# Patient Record
Sex: Male | Born: 1970 | Race: Black or African American | Hispanic: No | Marital: Single | Smoking: Current every day smoker
Health system: Southern US, Community
[De-identification: ages and names within clinical notes are randomized; demographics above are authoritative.]

## PROBLEM LIST (undated history)

## (undated) DIAGNOSIS — N529 Male erectile dysfunction, unspecified: Secondary | ICD-10-CM

## (undated) DIAGNOSIS — R7303 Prediabetes: Secondary | ICD-10-CM

## (undated) DIAGNOSIS — H1010 Acute atopic conjunctivitis, unspecified eye: Secondary | ICD-10-CM

## (undated) DIAGNOSIS — I10 Essential (primary) hypertension: Secondary | ICD-10-CM

## (undated) DIAGNOSIS — K219 Gastro-esophageal reflux disease without esophagitis: Secondary | ICD-10-CM

## (undated) DIAGNOSIS — L723 Sebaceous cyst: Secondary | ICD-10-CM

## (undated) DIAGNOSIS — E785 Hyperlipidemia, unspecified: Secondary | ICD-10-CM

## (undated) DIAGNOSIS — F1010 Alcohol abuse, uncomplicated: Secondary | ICD-10-CM

## (undated) HISTORY — PX: OTHER SURGICAL HISTORY: SHX169

---

## 2015-06-03 ENCOUNTER — Emergency Department
Admission: EM | Admit: 2015-06-03 | Discharge: 2015-06-03 | Disposition: A | Discharge status: Home / Self Care | Payer: BLUE CROSS/BLUE SHIELD | Attending: Emergency Medicine | Admitting: Emergency Medicine

## 2015-06-03 ENCOUNTER — Encounter: Payer: Self-pay | Admitting: Emergency Medicine

## 2015-06-03 DIAGNOSIS — I10 Essential (primary) hypertension: Secondary | ICD-10-CM | POA: Insufficient documentation

## 2015-06-03 DIAGNOSIS — H1011 Acute atopic conjunctivitis, right eye: Secondary | ICD-10-CM | POA: Diagnosis not present

## 2015-06-03 DIAGNOSIS — H109 Unspecified conjunctivitis: Secondary | ICD-10-CM | POA: Diagnosis present

## 2015-06-03 DIAGNOSIS — Z720 Tobacco use: Secondary | ICD-10-CM | POA: Diagnosis not present

## 2015-06-03 HISTORY — DX: Essential (primary) hypertension: I10

## 2015-06-03 MED ORDER — DIPHENHYDRAMINE HCL 50 MG PO CAPS
50.0000 mg | ORAL_CAPSULE | Freq: Once | ORAL | Status: AC
  Administered 2015-06-03: 50 mg via ORAL

## 2015-06-03 MED ORDER — HYDROXYZINE PAMOATE 25 MG PO CAPS: 25.0000 mg | ORAL_CAPSULE | Freq: Three times a day (TID) | ORAL | Status: DC | PRN

## 2015-06-03 MED ORDER — NEOMYCIN-POLYMYXIN-DEXAMETH 3.5-10000-0.1 OP SUSP
OPHTHALMIC | Status: AC
  Administered 2015-06-03: 2 [drp] via OPHTHALMIC
  Filled 2015-06-03: qty 5

## 2015-06-03 MED ORDER — DIPHENHYDRAMINE HCL 50 MG PO CAPS
ORAL_CAPSULE | ORAL | Status: AC
  Administered 2015-06-03: 50 mg via ORAL
  Filled 2015-06-03: qty 1

## 2015-06-03 MED ORDER — NEOMYCIN-POLYMYXIN-DEXAMETH 3.5-10000-0.1 OP SUSP
2.0000 [drp] | Freq: Four times a day (QID) | OPHTHALMIC | Status: DC
  Administered 2015-06-03: 2 [drp] via OPHTHALMIC

## 2015-06-03 MED ORDER — KETOROLAC TROMETHAMINE 10 MG PO TABS: 10.0000 mg | ORAL_TABLET | Freq: Four times a day (QID) | ORAL | Status: DC | PRN

## 2015-06-03 MED ORDER — LORATADINE 10 MG PO TABS: 10.0000 mg | ORAL_TABLET | Freq: Every day | ORAL | Status: DC

## 2015-06-03 MED ORDER — HYDROCHLOROTHIAZIDE 12.5 MG PO CAPS: 12.5000 mg | ORAL_CAPSULE | Freq: Every day | ORAL | Status: DC

## 2015-06-03 MED ORDER — KETOROLAC TROMETHAMINE 10 MG PO TABS
ORAL_TABLET | ORAL | Status: AC
  Administered 2015-06-03: 10 mg via ORAL
  Filled 2015-06-03: qty 1

## 2015-06-03 MED ORDER — KETOROLAC TROMETHAMINE 10 MG PO TABS
10.0000 mg | ORAL_TABLET | Freq: Once | ORAL | Status: AC
  Administered 2015-06-03: 10 mg via ORAL

## 2015-06-03 NOTE — ED Provider Notes (Signed)
[facility] Emergency Department Provider Note  ____________________________________________  Time seen: Approximately1510  I have reviewed the triage vital signs and the nursing notes.   HISTORY  Chief Complaint Conjunctivitis    HPI Calvin Taylor is a 44 y.o. 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 Hypertension     There are no active problems to display for this patient.   Past Surgical History  Procedure Laterality Date  . Right eye surgery      Current Outpatient Rx  Name  Route  Sig  Dispense  Refill  . hydrOXYzine (VISTARIL) 25 MG capsule   Oral   Take 1 capsule (25 mg total) by mouth every 8 (eight) hours as needed (allergies).   15 capsule   0   . ketorolac (TORADOL) 10 MG tablet   Oral   Take 1 tablet (10 mg total) by mouth every 6 (six) hours as needed.   20 tablet   0   . loratadine (CLARITIN) 10 MG tablet   Oral   Take 1 tablet (10 mg total) by mouth daily.   30 tablet   2     Allergies Review of patient's allergies indicates no known allergies.  No family history on file.  Social History History  Substance Use Topics  . Smoking status: Current Every Day Smoker    Types: Cigarettes  . Smokeless tobacco: Never Used  . Alcohol Use: 3.6 oz/week    6 Cans of beer per week    Review of Systems Constitutional: No fever/chills Eyes: No visual  changes. ENT: No sore throat. Cardiovascular: Denies chest pain. Respiratory: Denies shortness of breath. Gastrointestinal: No abdominal pain.  No nausea, no vomiting.  No diarrhea.  No constipation. Genitourinary: Negative for dysuria. Musculoskeletal: Negative for back pain. Skin: Negative for rash. Neurological: Negative for headaches, focal weakness or numbness.  10-point ROS otherwise negative.  ____________________________________________   PHYSICAL EXAM:  VITAL SIGNS: ED Triage Vitals  Enc Vitals Group     BP 06/03/15 1320 170/115 mmHg     Pulse Rate 06/03/15 1320 77     Resp 06/03/15 1320 18     Temp 06/03/15 1320 98.2 F (36.8 C)     Temp Source 06/03/15 1320 Oral     SpO2 06/03/15 1320 99 %     Weight 06/03/15 1320 161 lb (73.029 kg)     Height 06/03/15 1320 5\' 9"  (1.753 m)     Head Cir --      Peak Flow --      Pain Score 06/03/15 1323 5     Pain Loc --      Pain Edu? --      Excl. in [place]? --     Constitutional: Alert and oriented. Well appearing and in no acute distress. Eyes: Right eye red and injected conjunctiva mild swelling to the eyelids of the right  eye normal funduscopic exam clear humerus of the right eye. PERRL. EOMI. matting of the eyelashes Head: Atraumatic. Nose: Clear rhinorrhea Mouth/Throat: Mucous membranes are moist.  Oropharynx non-erythematous. Neck: No stridor.   Cardiovascular: Normal rate, regular rhythm. Grossly normal heart sounds.  Good peripheral circulation. Respiratory: Normal respiratory effort.  No retractions. Lungs CTAB. Gastrointestinal: Soft and nontender. No distention. No abdominal bruits. No CVA tenderness. Musculoskeletal: No lower extremity tenderness nor edema.  No joint effusions. Neurologic:  Normal speech and language. No gross focal neurologic deficits are appreciated. Speech is normal. No gait instability. Skin:  Skin is warm, dry and intact. No rash noted. Psychiatric: Mood and affect are normal. Speech and  behavior are normal.  ____________________________________________     PROCEDURES  Procedure(s) performed: None  Critical Care performed: No  ____________________________________________   INITIAL IMPRESSION / ASSESSMENT AND PLAN / ED COURSE  Pertinent labs & imaging results that were available during my care of the patient were reviewed by me and considered in my medical decision making (see chart for details).  Impression on this patient is allergic conjunctivitis that is turned into a bacterial conjunctivitis patient states around this time of years eyes water really badly eats normally controlled with Claritin he was at work couple days ago states his eyes really watering he rubbed his eye with the back of his club since and it's Red his eyes started matting and he has a thick discharge on the outside of his lashes says his vision is blurry due to the extra watering and itching on exam equal reactive pupils normal funduscopic exam and discharge him on antibiotic drops and a histamines anti-inflammatory 7 follow-up with ophthalmology in 2 days if symptoms persist  As of note the patient is a long-standing history of hypertension he has been off of his medicine for a couple years he knows his blood pressure is high but states the last time he took medicine it made his muscles cramp so he quit taking it and then quit going to a primary care provider I have written him a prescription for hydrochlorothiazide given him instructions on hypertension and diet trying to monitor his hypertension instructed him to follow up with primary care as soon as possible for his elevated blood pressure ____________________________________________   FINAL CLINICAL IMPRESSION(S) / ED DIAGNOSES  Final diagnoses:  Allergic conjunctivitis, right  Conjunctivitis of right eye   hypertension   Kloi Brodman Verdene Rio, PA-C 06/03/15 1609  Delman Kitten, MD 06/03/15 2359

## 2015-06-03 NOTE — ED Notes (Signed)
Pt reports 2 days ago redness, drainage and pain to right eye. Blurry vision to right eye.

## 2015-06-03 NOTE — ED Notes (Signed)
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

## 2015-06-03 NOTE — Discharge Instructions (Signed)
Allergic Conjunctivitis  The conjunctiva is a thin membrane that covers the visible white part of the eyeball and the underside of the eyelids. This membrane protects and lubricates the eye. The membrane has small blood vessels running through it that can normally be seen. When the conjunctiva becomes inflamed, the condition is called conjunctivitis. In response to the inflammation, the conjunctival blood vessels become swollen. The swelling results in redness in the normally white part of the eye.  The blood vessels of this membrane also react when a person has allergies and is then called allergic conjunctivitis. This condition usually lasts for as long as the allergy persists. Allergic conjunctivitis cannot be passed to another person (non-contagious). The likelihood of bacterial infection is great and the cause is not likely due to allergies if the inflamed eye has:  · A sticky discharge.  · Discharge or sticking together of the lids in the morning.  · Scaling or flaking of the eyelids where the eyelashes come out.  · Red swollen eyelids.  CAUSES   · Viruses.  · Irritants such as foreign bodies.  · Chemicals.  · General allergic reactions.  · Inflammation or serious diseases in the inside or the outside of the eye or the orbit (the boney cavity in which the eye sits) can cause a "red eye."  SYMPTOMS   · Eye redness.  · Tearing.  · Itchy eyes.  · Burning feeling in the eyes.  · Clear drainage from the eye.  · Allergic reaction due to pollens or ragweed sensitivity. Seasonal allergic conjunctivitis is frequent in the spring when pollens are in the air and in the fall.  DIAGNOSIS   This condition, in its many forms, is usually diagnosed based on the history and an ophthalmological exam. It usually involves both eyes. If your eyes react at the same time every year, allergies may be the cause. While most "red eyes" are due to allergy or an infection, the role of an eye (ophthalmological) exam is important. The exam  can rule out serious diseases of the eye or orbit.  TREATMENT   · Non-antibiotic eye drops, ointments, or medications by mouth may be prescribed if the ophthalmologist is sure the conjunctivitis is due to allergies alone.  · Over-the-counter drops and ointments for allergic symptoms should be used only after other causes of conjunctivitis have been ruled out, or as your caregiver suggests.  Medications by mouth are often prescribed if other allergy-related symptoms are present. If the ophthalmologist is sure that the conjunctivitis is due to allergies alone, treatment is normally limited to drops or ointments to reduce itching and burning.  HOME CARE INSTRUCTIONS   · Wash hands before and after applying drops or ointments, or touching the inflamed eye(s) or eyelids.  · Do not let the eye dropper tip or ointment tube touch the eyelid when putting medicine in your eye.  · Stop using your soft contact lenses and throw them away. Use a new pair of lenses when recovery is complete. You should run through sterilizing cycles at least three times before use after complete recovery if the old soft contact lenses are to be used. Hard contact lenses should be stopped. They need to be thoroughly sterilized before use after recovery.  · Itching and burning eyes due to allergies is often relieved by using a cool cloth applied to closed eye(s).  SEEK MEDICAL CARE IF:   · Your problems do not go away after two or three days of treatment.  ·   have extreme light sensitivity.  An oral temperature above 102 F (38.9 C) develops.  Pain in or around the eye or any other visual symptom develops. MAKE SURE YOU:   Understand these instructions.  Will watch your condition.  Will get help right away if you are not doing well or get worse. Document  Released: 02/21/2003 Document Revised: 02/23/2012 Document Reviewed: 01/17/2008 [facility] Patient Information 2015 [place], [place]. This information is not intended to replace advice given to you by your health care provider. Make sure you discuss any questions you have with your health care provider.  Bacterial Conjunctivitis Bacterial conjunctivitis, commonly called pink eye, is an inflammation of the clear membrane that covers the white part of the eye (conjunctiva). The inflammation can also happen on the underside of the eyelids. The blood vessels in the conjunctiva become inflamed, causing the eye to become red or pink. Bacterial conjunctivitis may spread easily from one eye to another and from person to person (contagious).  CAUSES  Bacterial conjunctivitis is caused by bacteria. The bacteria may come from your own skin, your upper respiratory tract, or from someone else with bacterial conjunctivitis. SYMPTOMS  The normally white color of the eye or the underside of the eyelid is usually pink or red. The pink eye is usually associated with irritation, tearing, and some sensitivity to light. Bacterial conjunctivitis is often associated with a thick, yellowish discharge from the eye. The discharge may turn into a crust on the eyelids overnight, which causes your eyelids to stick together. If a discharge is present, there may also be some blurred vision in the affected eye. DIAGNOSIS  Bacterial conjunctivitis is diagnosed by your caregiver through an eye exam and the symptoms that you report. Your caregiver looks for changes in the surface tissues of your eyes, which may point to the specific type of conjunctivitis. A sample of any discharge may be collected on a cotton-tip swab if you have a severe case of conjunctivitis, if your cornea is affected, or if you keep getting repeat infections that do not respond to treatment. The sample will be sent to a lab to see if the inflammation is caused by a  bacterial infection and to see if the infection will respond to antibiotic medicines. TREATMENT   Bacterial conjunctivitis is treated with antibiotics. Antibiotic eyedrops are most often used. However, antibiotic ointments are also available. Antibiotics pills are sometimes used. Artificial tears or eye washes may ease discomfort. HOME CARE INSTRUCTIONS   To ease discomfort, apply a cool, clean washcloth to your eye for 10-20 minutes, 3-4 times a day.  Gently wipe away any drainage from your eye with a warm, wet washcloth or a cotton ball.  Wash your hands often with soap and water. Use paper towels to dry your hands.  Do not share towels or washcloths. This may spread the infection.  Change or wash your pillowcase every day.  You should not use eye makeup until the infection is gone.  Do not operate machinery or drive if your vision is blurred.  Stop using contact lenses. Ask your caregiver how to sterilize or replace your contacts before using them again. This depends on the type of contact lenses that you use.  When applying medicine to the infected eye, do not touch the edge of your eyelid with the eyedrop bottle or ointment tube. SEEK IMMEDIATE MEDICAL CARE IF:   Your infection has not improved within 3 days after beginning treatment.  You had yellow discharge from your eye  and it returns.  You have increased eye pain.  Your eye redness is spreading.  Your vision becomes blurred.  You have a fever or persistent symptoms for more than 2-3 days.  You have a fever and your symptoms suddenly get worse.  You have facial pain, redness, or swelling. MAKE SURE YOU:   Understand these instructions.  Will watch your condition.  Will get help right away if you are not doing well or get worse. Document Released: 12/01/2005 Document Revised: 04/17/2014 Document Reviewed: 05/03/2012 [facility] Patient Information 2015 [place], [place]. This information is not intended to replace  advice given to you by your health care provider. Make sure you discuss any questions you have with your health care provider.

## 2018-04-28 DIAGNOSIS — I10 Essential (primary) hypertension: Secondary | ICD-10-CM | POA: Insufficient documentation

## 2018-04-30 DIAGNOSIS — R7303 Prediabetes: Secondary | ICD-10-CM | POA: Insufficient documentation

## 2019-03-11 ENCOUNTER — Telehealth: Payer: Self-pay | Admitting: Family Medicine

## 2019-03-11 ENCOUNTER — Other Ambulatory Visit (HOSPITAL_COMMUNITY)
Admission: RE | Admit: 2019-03-11 | Discharge: 2019-03-11 | Disposition: A | Discharge status: Home / Self Care | Payer: BLUE CROSS/BLUE SHIELD | Source: Ambulatory Visit | Attending: Family Medicine | Admitting: Family Medicine

## 2019-03-11 ENCOUNTER — Ambulatory Visit (INDEPENDENT_AMBULATORY_CARE_PROVIDER_SITE_OTHER): Payer: BLUE CROSS/BLUE SHIELD | Admitting: Family Medicine

## 2019-03-11 ENCOUNTER — Other Ambulatory Visit: Payer: Self-pay

## 2019-03-11 ENCOUNTER — Encounter: Payer: Self-pay | Admitting: Family Medicine

## 2019-03-11 VITALS — BP 158/84 | HR 92 | Resp 16 | Ht 70.0 in | Wt 166.5 lb

## 2019-03-11 DIAGNOSIS — Z1159 Encounter for screening for other viral diseases: Secondary | ICD-10-CM | POA: Diagnosis not present

## 2019-03-11 DIAGNOSIS — N529 Male erectile dysfunction, unspecified: Secondary | ICD-10-CM

## 2019-03-11 DIAGNOSIS — R7303 Prediabetes: Secondary | ICD-10-CM | POA: Diagnosis not present

## 2019-03-11 DIAGNOSIS — Z125 Encounter for screening for malignant neoplasm of prostate: Secondary | ICD-10-CM | POA: Diagnosis not present

## 2019-03-11 DIAGNOSIS — Z113 Encounter for screening for infections with a predominantly sexual mode of transmission: Secondary | ICD-10-CM | POA: Insufficient documentation

## 2019-03-11 DIAGNOSIS — I10 Essential (primary) hypertension: Secondary | ICD-10-CM

## 2019-03-11 DIAGNOSIS — D234 Other benign neoplasm of skin of scalp and neck: Secondary | ICD-10-CM

## 2019-03-11 DIAGNOSIS — F1010 Alcohol abuse, uncomplicated: Secondary | ICD-10-CM | POA: Diagnosis not present

## 2019-03-11 DIAGNOSIS — Z23 Encounter for immunization: Secondary | ICD-10-CM

## 2019-03-11 DIAGNOSIS — K219 Gastro-esophageal reflux disease without esophagitis: Secondary | ICD-10-CM | POA: Diagnosis not present

## 2019-03-11 DIAGNOSIS — Z13220 Encounter for screening for lipoid disorders: Secondary | ICD-10-CM

## 2019-03-11 DIAGNOSIS — Z114 Encounter for screening for human immunodeficiency virus [HIV]: Secondary | ICD-10-CM | POA: Diagnosis not present

## 2019-03-11 MED ORDER — HYDROCHLOROTHIAZIDE 12.5 MG PO CAPS: 12.5000 mg | ORAL_CAPSULE | Freq: Every day | ORAL | 1 refills | Status: DC

## 2019-03-11 MED ORDER — FAMOTIDINE 40 MG PO TABS: 40.0000 mg | ORAL_TABLET | Freq: Every day | ORAL | 1 refills | Status: DC

## 2019-03-11 MED ORDER — TADALAFIL 20 MG PO TABS: 10.0000 mg | ORAL_TABLET | ORAL | 3 refills | Status: DC | PRN

## 2019-03-11 NOTE — Telephone Encounter (Signed)
Copied from [place] [phone]. Topic: Quick Communication - Rx Refill/Question >> Mar 11, 2019  2:13 PM Scherrie Gerlach wrote: Medication: loratadine (CLARITIN) 10 MG tablet  Pt was seen today and wants to know if the dr will send in a Rx for this med as well. [place] (N), [place] [facility] [place] [phone] (Phone) [phone] (Fax)

## 2019-03-11 NOTE — Patient Instructions (Signed)
Download [place] on your phone for your upcoming video visit.

## 2019-03-11 NOTE — Progress Notes (Signed)
Name: Calvin Taylor   MRN: [id number]    DOB: [birth year]   Date:03/11/2019       Progress Note  Subjective  Chief Complaint  Chief Complaint  Patient presents with  . Establish Care  . Hypertension  . Sinusitis  . Cyst    on back of head  . Gastroesophageal Reflux    HPI  GERD: Notes heartburn and sour taste in mouth ongoing for about a month, states sometimes worse at night when he lays down.  Has not taken anything for the problem and denies vomiting, BRB in stool or dark and tarry stool, abdominal pain, or difficulty swallowing.  HTN: States thinks it is genetic, has been on medication for a few months now - was on something back in 2012 as well.  BP is not at goal yet.  Currently taking lisinopril 20mg .  Denies chest pain, shortness of breath, swelling in LE, blurred vision, or headaches.  He has a BP cuff at home and can check at home.  Allergies: Taking claritin PRN - gets watery eyes, rhinorrhea.  Doing well at this time.  Alcohol Abuse: Drinking 12oz cans - 6-7 a day and a few shots of liquor.  Has been drinking heavily since he was about 72-9 years old. Was working for a moving company, but quit recently.  His first drink is around 5pm, last drink is around 8pm.  He does not feel that his alcohol abuse is an issue at this time. Significant counseling is provided.    Office Visit from 03/11/2019 in [employer]  AUDIT-C Score  8     Cysts:  Has two cysts on head/neck - one on the back of the scalp on the RIGHT side, one on the posterior neck on the right side.  Present for about 1 month, have been getting larger.  Has had some shooting pain from the cyst on the scalp to the cyst on the neck.  No vision changes, full neck AROM.  Prediabetes: Last A1C was 6.1; denies polydipsia, polyphagia.  Does have occasional nocturia, has some urinary frequency during the day.  ED: He is having trouble since taking lisinopril - has been taking OTC medication that had  been helping but caused palpitations. Advised to stop OTC supplements, we will send in Cialis. When able to achieve erection, is able to also achieve orgasm.   There are no active problems to display for this patient.   Past Surgical History:  Procedure Laterality Date  . right eye surgery      Family History  Problem Relation Age of Onset  . Hypertension Mother   . Hypertension Father     Social History   Socioeconomic History  . Marital status: Single    Spouse name: Not on file  . Number of children: 1  . Years of education: Not on file  . Highest education level: Not on file  Occupational History  . Not on file  Social Needs  . Financial resource strain: Not hard at all  . Food insecurity:    Worry: Never true    Inability: Never true  . Transportation needs:    Medical: No    Non-medical: No  Tobacco Use  . Smoking status: Current Every Day Smoker    Types: Cigarettes  . Smokeless tobacco: Never Used  Substance and Sexual Activity  . Alcohol use: Yes    Alcohol/week: 6.0 standard drinks    Types: 6 Cans of beer per week  .  Drug use: No  . Sexual activity: Yes    Partners: Female  Lifestyle  . Physical activity:    Days per week: 0 days    Minutes per session: 0 min  . Stress: Not at all  Relationships  . Social connections:    Talks on phone: Twice a week    Gets together: Twice a week    Attends religious service: Never    Active member of club or organization: No    Attends meetings of clubs or organizations: Never    Relationship status: Never married  . Intimate partner violence:    Fear of current or ex partner: No    Emotionally abused: No    Physically abused: No    Forced sexual activity: No  Other Topics Concern  . Not on file  Social History Narrative  . Not on file     Current Outpatient Medications:  .  lisinopril (PRINIVIL,ZESTRIL) 20 MG tablet, Take by mouth., Disp: , Rfl:  .  hydrochlorothiazide (MICROZIDE) 12.5 MG capsule,  Take 1 capsule (12.5 mg total) by mouth daily. (Patient not taking: Reported on 03/11/2019), Disp: 30 capsule, Rfl: 1 .  hydrOXYzine (VISTARIL) 25 MG capsule, Take 1 capsule (25 mg total) by mouth every 8 (eight) hours as needed (allergies). (Patient not taking: Reported on 03/11/2019), Disp: 15 capsule, Rfl: 0 .  ketorolac (TORADOL) 10 MG tablet, Take 1 tablet (10 mg total) by mouth every 6 (six) hours as needed. (Patient not taking: Reported on 03/11/2019), Disp: 20 tablet, Rfl: 0 .  loratadine (CLARITIN) 10 MG tablet, Take 1 tablet (10 mg total) by mouth daily., Disp: 30 tablet, Rfl: 2  No Known Allergies  I personally reviewed active problem list, medication list, allergies, family history, social history, health maintenance, notes from last encounter, lab results with the patient/caregiver today.   ROS  Constitutional: Negative for fever or weight change.  Respiratory: Negative for cough and shortness of breath.   Cardiovascular: Negative for chest pain or palpitations.  Gastrointestinal: Negative for abdominal pain, no bowel changes.  Musculoskeletal: Negative for gait problem or joint swelling.  Skin: Negative for rash. See above regarding cysts Neurological: Negative for dizziness or headache.  No other specific complaints in a complete review of systems (except as listed in HPI above).  Objective  Vitals:   03/11/19 1030  BP: (!) 158/84  Pulse: 92  Resp: 16  SpO2: 99%  Weight: 166 lb 8 oz (75.5 kg)  Height: 5\' 10"  (1.778 m)   Body mass index is 23.89 kg/m.  Physical Exam  Constitutional: Patient appears well-developed and well-nourished. No distress.  HENT: Head: Normocephalic and atraumatic. Eyes: Conjunctivae and EOM are normal. No scleral icterus. Neck: Normal range of motion. Neck supple. No JVD present. No thyromegaly present.  Cardiovascular: Normal rate, regular rhythm and normal heart sounds.  No murmur heard. No BLE edema. Pulmonary/Chest: Effort normal and  breath sounds normal. No respiratory distress. Musculoskeletal: Normal range of motion, no joint effusions. No gross deformities Neurological: Pt is alert and oriented to person, place, and time. No cranial nerve deficit. Coordination, balance, strength, speech and gait are normal.  Skin: Skin is warm and dry. No rash noted. No erythema. Cm diameter cystic lesion on the RIGHt posterior occiput.  There is another small palpable mass on the posterior right neck - question cyst vs lymph node. Non tender. Psychiatric: Patient has a normal mood and affect. behavior is normal. Judgment and thought content normal.  No results  found for this or any previous visit (from the past 55 hour(s)).   PHQ2/9: Depression screen [facility] 2/9 03/11/2019  Decreased Interest 0  Down, Depressed, Hopeless 0  PHQ - 2 Score 0  Altered sleeping 0  Tired, decreased energy 0  Change in appetite 0  Feeling bad or failure about yourself  0  Trouble concentrating 0  Moving slowly or fidgety/restless 0  Suicidal thoughts 0  PHQ-9 Score 0  Difficult doing work/chores Not difficult at all   PHQ-2/9 Result is negative.    Fall Risk: Fall Risk  03/11/2019  Falls in the past year? 1  Number falls in past yr: 1  Injury with Fall? 0  Follow up Falls evaluation completed   Assessment & Plan  1. Essential hypertension - DASH diet discussed - COMPLETE METABOLIC PANEL WITH GFR - hydrochlorothiazide (MICROZIDE) 12.5 MG capsule; Take 1 capsule (12.5 mg total) by mouth daily.  Dispense: 30 capsule; Refill: 1  2. Gastroesophageal reflux disease without esophagitis - famotidine (PEPCID) 40 MG tablet; Take 1 tablet (40 mg total) by mouth at bedtime.  Dispense: 90 tablet; Refill: 1  3. Encounter for screening for HIV - HIV Antibody (routine testing w rflx)  4. Need for hepatitis C screening test - Hepatitis C antibody  5. Routine screening for STI (sexually transmitted infection) - HIV Antibody (routine testing w rflx) -  RPR - Urine cytology ancillary only  6. Need for Tdap vaccination - Tdap vaccine greater than or equal to 7yo IM  7. Cyst, dermoid, scalp and neck - Ambulatory referral to General Surgery  8. Alcohol abuse - Significant counseling  - COMPLETE METABOLIC PANEL WITH GFR - CBC with Differential/Platelet - B12 and Folate Panel - Vitamin B1  9. Prediabetes - diabetic diet and reductionin ETOH is discussed - Hemoglobin A1c  10. Lipid screening - Lipid panel  11. Prostate cancer screening - PSA  12. Erectile dysfunction, unspecified erectile dysfunction type - tadalafil (ADCIRCA/CIALIS) 20 MG tablet; Take 0.5-1 tablets (10-20 mg total) by mouth every other day as needed for erectile dysfunction.  Dispense: 10 tablet; Refill: 3

## 2019-03-14 LAB — B12 AND FOLATE PANEL
Folate: 12.5 ng/mL
VITAMIN B 12: 432 pg/mL (ref 200–1100)

## 2019-03-14 LAB — LIPID PANEL
Cholesterol: 194 mg/dL (ref <200)
HDL: 78 mg/dL (ref >=40)
LDL CHOLESTEROL (CALC): 85 mg/dL
Non-HDL Cholesterol (Calc): 116 mg/dL (calc) (ref <130)
Total CHOL/HDL Ratio: 2.5 (calc) (ref <5.0)
Triglycerides: 222 mg/dL — ABNORMAL HIGH (ref <150)

## 2019-03-14 LAB — COMPLETE METABOLIC PANEL WITH GFR
AG RATIO: 1.8 (calc) (ref 1.0–2.5)
ALBUMIN MSPROF: 4.6 g/dL (ref 3.6–5.1)
ALT: 13 U/L (ref 9–46)
AST: 18 U/L (ref 10–40)
Alkaline phosphatase (APISO): 85 U/L (ref 36–130)
BILIRUBIN TOTAL: 0.4 mg/dL (ref 0.2–1.2)
BUN: 17 mg/dL (ref 7–25)
CHLORIDE: 100 mmol/L (ref 98–110)
CO2: 29 mmol/L (ref 20–32)
Calcium: 9.6 mg/dL (ref 8.6–10.3)
Creat: 1.21 mg/dL (ref 0.60–1.35)
GFR, Est African American: 82 mL/min/{1.73_m2} (ref >=60)
GFR, Est Non African American: 70 mL/min/{1.73_m2} (ref >=60)
Globulin: 2.5 g/dL (calc) (ref 1.9–3.7)
Glucose, Bld: 86 mg/dL (ref 65–99)
POTASSIUM: 4.2 mmol/L (ref 3.5–5.3)
Sodium: 136 mmol/L (ref 135–146)
Total Protein: 7.1 g/dL (ref 6.1–8.1)

## 2019-03-14 LAB — CBC WITH DIFFERENTIAL/PLATELET
ABSOLUTE MONOCYTES: 529 {cells}/uL (ref 200–950)
BASOS PCT: 0.6 %
Basophils Absolute: 38 cells/uL (ref 0–200)
Eosinophils Absolute: 158 cells/uL (ref 15–500)
Eosinophils Relative: 2.5 %
HCT: 44.3 % (ref 38.5–50.0)
Hemoglobin: 15.5 g/dL (ref 13.2–17.1)
Lymphs Abs: 1392 cells/uL (ref 850–3900)
MCH: 32.2 pg (ref 27.0–33.0)
MCHC: 35 g/dL (ref 32.0–36.0)
MCV: 91.9 fL (ref 80.0–100.0)
MONOS PCT: 8.4 %
MPV: 11.1 fL (ref 7.5–12.5)
Neutro Abs: 4183 cells/uL (ref 1500–7800)
Neutrophils Relative %: 66.4 %
PLATELETS: 222 10*3/uL (ref 140–400)
RBC: 4.82 10*6/uL (ref 4.20–5.80)
RDW: 12.2 % (ref 11.0–15.0)
TOTAL LYMPHOCYTE: 22.1 %
WBC: 6.3 10*3/uL (ref 3.8–10.8)

## 2019-03-14 LAB — PSA: PSA: 0.6 ng/mL (ref <=4.0)

## 2019-03-14 LAB — VITAMIN B1: Vitamin B1 (Thiamine): 9 nmol/L (ref 8–30)

## 2019-03-14 LAB — HEPATITIS C ANTIBODY
HEP C AB: NONREACTIVE
SIGNAL TO CUT-OFF: 0.03 (ref <1.00)

## 2019-03-14 LAB — HEMOGLOBIN A1C
EAG (MMOL/L): 6.5 (calc)
HEMOGLOBIN A1C: 5.7 %{Hb} — AB (ref <5.7)
Mean Plasma Glucose: 117 (calc)

## 2019-03-14 LAB — URINE CYTOLOGY ANCILLARY ONLY
Chlamydia: NEGATIVE
Neisseria Gonorrhea: NEGATIVE

## 2019-03-14 LAB — HIV ANTIBODY (ROUTINE TESTING W REFLEX): HIV 1&2 Ab, 4th Generation: NONREACTIVE

## 2019-03-14 LAB — RPR: RPR: NONREACTIVE

## 2019-03-14 MED ORDER — LORATADINE 10 MG PO TABS: 10.0000 mg | ORAL_TABLET | Freq: Every day | ORAL | 3 refills | Status: DC

## 2019-03-16 ENCOUNTER — Ambulatory Visit (INDEPENDENT_AMBULATORY_CARE_PROVIDER_SITE_OTHER): Payer: BLUE CROSS/BLUE SHIELD | Admitting: Surgery

## 2019-03-16 ENCOUNTER — Encounter: Payer: Self-pay | Admitting: Surgery

## 2019-03-16 ENCOUNTER — Other Ambulatory Visit: Payer: Self-pay

## 2019-03-16 DIAGNOSIS — L723 Sebaceous cyst: Secondary | ICD-10-CM

## 2019-03-16 NOTE — Patient Instructions (Addendum)
Patient will need to return to the office in 1 month to discuss surgery. The office will update you with any changes.   Call the office with any questions or concerns.

## 2019-03-16 NOTE — Progress Notes (Signed)
03/16/2019  Reason for Visit:  Sebaceous cysts of scalp and neck  Referring Provider:  Raelyn Ensign, FNP  History of Present Illness: Calvin Taylor is a 48 y.o. male presenting for evaluation of sebaceous cysts of the scalp as well as the neck.  He saw his PCP on 3/27 and was referred to our office for further evaluation.  The patient reports that the cyst has been present for about a month and has been getting larger with some associated shooting pain from the cyst on the scalp to the cyst on the neck.  He also reports having a recent sinus infection and he got medication for it recently.  He reports that at first he thought the scalp cyst was a bump from him hitting his head at work but as it was not going away and getting larger, he became more concerned.  He denies any redness or worsening tenderness over the scalp cyst denies any drainage from either location.  He reports that sometimes he feels soft and sometimes it feels harder.  Past Medical History: Past Medical History:  Diagnosis Date  . Hypertension GERD Allergies Prediabetes ED      Past Surgical History: Past Surgical History:  Procedure Laterality Date  . right eye surgery      Home Medications: Prior to Admission medications   Medication Sig Start Date End Date Taking? Authorizing Provider  famotidine (PEPCID) 40 MG tablet Take 1 tablet (40 mg total) by mouth at bedtime. 03/11/19   Hubbard Hartshorn, FNP  hydrochlorothiazide (MICROZIDE) 12.5 MG capsule Take 1 capsule (12.5 mg total) by mouth daily. 03/11/19   Hubbard Hartshorn, FNP  lisinopril (PRINIVIL,ZESTRIL) 20 MG tablet Take by mouth. 05/03/18   [provider]  loratadine (CLARITIN) 10 MG tablet Take 1 tablet (10 mg total) by mouth daily. 03/14/19 03/13/20  Hubbard Hartshorn, FNP  tadalafil (ADCIRCA/CIALIS) 20 MG tablet Take 0.5-1 tablets (10-20 mg total) by mouth every other day as needed for erectile dysfunction. 03/11/19   Hubbard Hartshorn, FNP    Allergies: No  Known Allergies  Social History:  reports that he has been smoking cigarettes. He has never used smokeless tobacco. He reports current alcohol use of about 6.0 standard drinks of alcohol per week. He reports that he does not use drugs.   Family History: Family History  Problem Relation Age of Onset  . Hypertension Mother   . Hypertension Father     Review of Systems: Review of Systems  Constitutional: Negative for chills and fever.  Respiratory: Negative for shortness of breath.   Cardiovascular: Negative for chest pain.  Gastrointestinal: Negative for abdominal pain, nausea and vomiting.  Genitourinary: Negative for dysuria.  Musculoskeletal: Negative for myalgias.  Skin: Positive for itching (scalp cyst area). Negative for rash.  Neurological: Negative for dizziness.  Psychiatric/Behavioral: Negative for depression.    Physical Exam BP (!) 162/119   Pulse 88   Temp 98.1 F (36.7 C) (Temporal)   Resp 16   Ht 5\' 10"  (1.778 m)   Wt 163 lb 3.2 oz (74 kg)   SpO2 98%   BMI 23.42 kg/m  CONSTITUTIONAL: No acute distress HEENT:  Normocephalic, atraumatic, extraocular motion intact. NECK: Trachea is midline, and there is no jugular venous distension.  RESPIRATORY:  Lungs are clear, and breath sounds are equal bilaterally. Normal respiratory effort without pathologic use of accessory muscles. CARDIOVASCULAR: Heart is regular without murmurs, gallops, or rubs. GI: The abdomen is soft, nondistended, nontender.  MUSCULOSKELETAL:  Normal muscle strength and tone in all four extremities.  No peripheral edema or cyanosis. SKIN: Right posterior scalp cyst appears to be a sebaceous cyst measures about 2 cm in size.  There is no drainage from this area no significant tenderness to palpation.  There is no erythema or evidence of infection.  It is soft on the right posterior neck, there is a small nodule measuring about 1.5 cm in size that appears to be a lymph node as it is the same overall  location compared to the left side where there is a lymph node there.  There is no evidence of infection or drainage and just feels below the skin level rather than at the skin level. NEUROLOGIC:  Motor and sensation is grossly normal.  Cranial nerves are grossly intact. PSYCH:  Alert and oriented to person, place and time. Affect is normal.  Laboratory Analysis: Labs from 03/11/2019: Sodium 136, potassium 4.2, chloride 100, CO2 29, BUN 17, creatinine 1.21.  Total bilirubin 0.4, AST 18, ALT 13, alkaline phosphatase 85.  WBC 6.3, hemoglobin 15.5, hematocrit 44.3, platelet 222.  Imaging: No results found.  Assessment and Plan: This is a 48 y.o. male with a sebaceous cyst of the anterior right scalp and likely mildly enlarged right posterior neck lymph node.  Discussed with the patient that currently there is no evidence that the scalp cyst is infected.  Discussed with him that I really feel the nodule on the right posterior neck is likely a lymph node.  Patient reports that he did have a recent sinus infection and this would be consistent with a reactive lymph node.  At this point cyst on the scalp does appear to be a sebaceous cyst and is not infected.  No drainage procedures are needed.  Discussed with the patient that because of the COVID-19 restrictions at this point, we are not able to schedule him for an elective procedure to excise the cyst.  Given its location on the scalp, I would feel more comfortable doing this in the operating room than in the office in case there is more significant bleeding.  The patient is in agreement with this and he is comfortable waiting until the restrictions are lifted in order to schedule a new appointment and set him up for surgery.  My hope would be that this is in 1 month's time that we can call him and set up a new appointment to schedule.  Return precautions were given to the patient particularly if the cyst on the scalp is getting larger, with more tenderness  or redness or with any purulent drainage.  Patient understands these instructions  Face-to-face time spent with the patient and care providers was 40 minutes, with more than 50% of the time spent counseling, educating, and coordinating care of the patient.     Melvyn Neth, [facility]

## 2019-03-25 ENCOUNTER — Ambulatory Visit (INDEPENDENT_AMBULATORY_CARE_PROVIDER_SITE_OTHER): Payer: BLUE CROSS/BLUE SHIELD | Admitting: Family Medicine

## 2019-03-25 ENCOUNTER — Other Ambulatory Visit: Payer: Self-pay

## 2019-03-25 DIAGNOSIS — Z91199 Patient's noncompliance with other medical treatment and regimen due to unspecified reason: Secondary | ICD-10-CM

## 2019-03-25 DIAGNOSIS — Z5329 Procedure and treatment not carried out because of patient's decision for other reasons: Secondary | ICD-10-CM

## 2019-03-25 NOTE — Progress Notes (Signed)
Erroneous - no show

## 2019-04-06 ENCOUNTER — Encounter: Payer: Self-pay | Admitting: Emergency Medicine

## 2019-04-06 ENCOUNTER — Emergency Department: Payer: BLUE CROSS/BLUE SHIELD

## 2019-04-06 ENCOUNTER — Emergency Department
Admission: EM | Admit: 2019-04-06 | Discharge: 2019-04-06 | Disposition: A | Discharge status: Home / Self Care | Payer: BLUE CROSS/BLUE SHIELD | Attending: Student in an Organized Health Care Education/Training Program | Admitting: Student in an Organized Health Care Education/Training Program

## 2019-04-06 ENCOUNTER — Other Ambulatory Visit: Payer: Self-pay

## 2019-04-06 ENCOUNTER — Ambulatory Visit: Payer: Self-pay

## 2019-04-06 DIAGNOSIS — S3992XA Unspecified injury of lower back, initial encounter: Secondary | ICD-10-CM | POA: Diagnosis not present

## 2019-04-06 DIAGNOSIS — S0990XA Unspecified injury of head, initial encounter: Secondary | ICD-10-CM | POA: Insufficient documentation

## 2019-04-06 DIAGNOSIS — F17210 Nicotine dependence, cigarettes, uncomplicated: Secondary | ICD-10-CM | POA: Diagnosis not present

## 2019-04-06 DIAGNOSIS — Z79899 Other long term (current) drug therapy: Secondary | ICD-10-CM | POA: Diagnosis not present

## 2019-04-06 DIAGNOSIS — Y939 Activity, unspecified: Secondary | ICD-10-CM | POA: Diagnosis not present

## 2019-04-06 DIAGNOSIS — S79911A Unspecified injury of right hip, initial encounter: Secondary | ICD-10-CM | POA: Diagnosis not present

## 2019-04-06 DIAGNOSIS — Y92410 Unspecified street and highway as the place of occurrence of the external cause: Secondary | ICD-10-CM | POA: Diagnosis not present

## 2019-04-06 DIAGNOSIS — I10 Essential (primary) hypertension: Secondary | ICD-10-CM | POA: Insufficient documentation

## 2019-04-06 DIAGNOSIS — Y999 Unspecified external cause status: Secondary | ICD-10-CM | POA: Diagnosis not present

## 2019-04-06 DIAGNOSIS — M545 Low back pain: Secondary | ICD-10-CM | POA: Diagnosis not present

## 2019-04-06 DIAGNOSIS — S299XXA Unspecified injury of thorax, initial encounter: Secondary | ICD-10-CM | POA: Diagnosis not present

## 2019-04-06 DIAGNOSIS — M25551 Pain in right hip: Secondary | ICD-10-CM | POA: Diagnosis not present

## 2019-04-06 MED ORDER — METHOCARBAMOL 500 MG PO TABS: 500.0000 mg | ORAL_TABLET | Freq: Three times a day (TID) | ORAL | 0 refills | Status: AC | PRN

## 2019-04-06 MED ORDER — MELOXICAM 15 MG PO TABS: 15.0000 mg | ORAL_TABLET | Freq: Every day | ORAL | 1 refills | Status: AC

## 2019-04-06 MED ORDER — METHOCARBAMOL 500 MG PO TABS
1000.0000 mg | ORAL_TABLET | Freq: Once | ORAL | Status: AC
  Administered 2019-04-06: 18:00:00 1000 mg via ORAL
  Filled 2019-04-06: qty 2
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
## 2019-04-06 NOTE — ED Notes (Signed)
Patient transported to CT 
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
## 2019-04-07 NOTE — Telephone Encounter (Signed)
Reviewed ED visit.  We can refer to [facility] if that what he would like.  I am placing this now.

## 2019-04-07 NOTE — Telephone Encounter (Signed)
Do you want him to go to [facility]

## 2019-04-07 NOTE — Addendum Note (Signed)
Addended by: Hubbard Hartshorn on: 04/07/2019 02:22 PM   Modules accepted: Orders

## 2019-04-08 NOTE — Telephone Encounter (Signed)
Patient notified. Referral sent to [facility]. They will reach out to patient. Back pain and knee pain

## 2019-04-11 ENCOUNTER — Ambulatory Visit: Payer: Self-pay

## 2019-04-11 DIAGNOSIS — K219 Gastro-esophageal reflux disease without esophagitis: Secondary | ICD-10-CM

## 2019-04-11 DIAGNOSIS — I10 Essential (primary) hypertension: Secondary | ICD-10-CM

## 2019-04-11 NOTE — Telephone Encounter (Signed)
Pt called to have 3 medications refilled  Answer Assessment - Initial Assessment Questions 1. SYMPTOMS: "Do you have any symptoms?"     none 2. SEVERITY: If symptoms are present, ask "Are they mild, moderate or severe?"     Pt requesting refills on lisinopril, Hydrochlorothiazide, famotitin  Protocols used: MEDICATION QUESTION CALL-A-AH

## 2019-04-14 MED ORDER — LISINOPRIL 20 MG PO TABS: 20.0000 mg | ORAL_TABLET | Freq: Every day | ORAL | 0 refills | Status: DC

## 2019-04-14 MED ORDER — HYDROCHLOROTHIAZIDE 12.5 MG PO CAPS: 12.5000 mg | ORAL_CAPSULE | Freq: Every day | ORAL | 0 refills | Status: DC

## 2019-04-14 NOTE — Telephone Encounter (Signed)
appt scheduled for 5.7.2020

## 2019-04-14 NOTE — Addendum Note (Signed)
Addended by: Hubbard Hartshorn on: 04/14/2019 12:26 PM   Modules accepted: Orders

## 2019-04-14 NOTE — Telephone Encounter (Signed)
Patient needs to schedule a visit in the next 1-2 weeks for additional refills. Needs to be able to check his blood pressure at home or else he needs to come in for in-person visit.

## 2019-04-21 ENCOUNTER — Other Ambulatory Visit: Payer: Self-pay

## 2019-04-21 ENCOUNTER — Encounter: Payer: Self-pay | Admitting: Family Medicine

## 2019-04-21 ENCOUNTER — Ambulatory Visit (INDEPENDENT_AMBULATORY_CARE_PROVIDER_SITE_OTHER): Payer: BLUE CROSS/BLUE SHIELD | Admitting: Family Medicine

## 2019-04-21 VITALS — BP 150/99 | HR 82

## 2019-04-21 DIAGNOSIS — K219 Gastro-esophageal reflux disease without esophagitis: Secondary | ICD-10-CM | POA: Diagnosis not present

## 2019-04-21 DIAGNOSIS — R7303 Prediabetes: Secondary | ICD-10-CM

## 2019-04-21 DIAGNOSIS — I10 Essential (primary) hypertension: Secondary | ICD-10-CM | POA: Diagnosis not present

## 2019-04-21 DIAGNOSIS — F1010 Alcohol abuse, uncomplicated: Secondary | ICD-10-CM | POA: Insufficient documentation

## 2019-04-21 DIAGNOSIS — D234 Other benign neoplasm of skin of scalp and neck: Secondary | ICD-10-CM | POA: Diagnosis not present

## 2019-04-21 DIAGNOSIS — F1020 Alcohol dependence, uncomplicated: Secondary | ICD-10-CM | POA: Insufficient documentation

## 2019-04-21 MED ORDER — HYDROCHLOROTHIAZIDE 25 MG PO TABS: 25.0000 mg | ORAL_TABLET | Freq: Every day | ORAL | 1 refills | Status: DC
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
## 2019-04-22 ENCOUNTER — Ambulatory Visit: Payer: Self-pay | Admitting: Surgery

## 2019-04-25 ENCOUNTER — Telehealth: Payer: Self-pay | Admitting: *Deleted

## 2019-04-25 NOTE — Telephone Encounter (Signed)
Spoke with the patient today since he was a no show for appointment with Dr. Hampton Abbot on 04-22-19.  The patient states the cyst on his head popped and he no longer needs to have surgery and is declining a follow up appointment with Dr. Hampton Abbot at this time.   Note routed to Dr. Hampton Abbot.

## 2019-04-26 ENCOUNTER — Ambulatory Visit: Payer: BLUE CROSS/BLUE SHIELD | Admitting: Emergency Medicine

## 2019-04-26 ENCOUNTER — Other Ambulatory Visit: Payer: Self-pay

## 2019-04-26 VITALS — BP 130/86 | HR 97 | Temp 98.0°F | Resp 16 | Ht 67.0 in | Wt 162.0 lb

## 2019-04-26 DIAGNOSIS — I10 Essential (primary) hypertension: Secondary | ICD-10-CM

## 2019-04-26 LAB — BASIC METABOLIC PANEL WITH GFR
BUN: 13 mg/dL (ref 7–25)
CO2: 31 mmol/L (ref 20–32)
Calcium: 10.1 mg/dL (ref 8.6–10.3)
Chloride: 99 mmol/L (ref 98–110)
Creat: 1.31 mg/dL (ref 0.60–1.35)
GFR, Est African American: 74 mL/min/{1.73_m2} (ref >=60)
GFR, Est Non African American: 64 mL/min/{1.73_m2} (ref >=60)
Glucose, Bld: 96 mg/dL (ref 65–99)
Potassium: 4.2 mmol/L (ref 3.5–5.3)
Sodium: 137 mmol/L (ref 135–146)

## 2019-05-06 ENCOUNTER — Other Ambulatory Visit: Payer: Self-pay | Admitting: Family Medicine

## 2019-05-06 DIAGNOSIS — I10 Essential (primary) hypertension: Secondary | ICD-10-CM

## 2019-05-18 ENCOUNTER — Telehealth: Payer: Self-pay | Admitting: Family Medicine

## 2019-05-18 DIAGNOSIS — K219 Gastro-esophageal reflux disease without esophagitis: Secondary | ICD-10-CM

## 2019-05-18 NOTE — Telephone Encounter (Signed)
Pt went to the pharmacy to pick up famotidine and the pharmacy and the distrubiter does not know when they will have more in stock. Pt is asking if you could call in something different

## 2019-05-19 MED ORDER — CIMETIDINE 300 MG PO TABS: 300.0000 mg | ORAL_TABLET | Freq: Two times a day (BID) | ORAL | 2 refills | Status: DC

## 2019-05-19 NOTE — Telephone Encounter (Signed)
Please send in new script

## 2019-07-26 ENCOUNTER — Other Ambulatory Visit: Payer: Self-pay

## 2019-07-26 ENCOUNTER — Encounter: Payer: Self-pay | Admitting: Family Medicine

## 2019-07-26 ENCOUNTER — Ambulatory Visit (INDEPENDENT_AMBULATORY_CARE_PROVIDER_SITE_OTHER): Payer: BLUE CROSS/BLUE SHIELD | Admitting: Family Medicine

## 2019-07-26 VITALS — BP 112/80 | HR 77 | Temp 97.5°F | Resp 18 | Ht 67.0 in | Wt 162.3 lb

## 2019-07-26 DIAGNOSIS — M7701 Medial epicondylitis, right elbow: Secondary | ICD-10-CM | POA: Diagnosis not present

## 2019-07-26 DIAGNOSIS — K219 Gastro-esophageal reflux disease without esophagitis: Secondary | ICD-10-CM

## 2019-07-26 DIAGNOSIS — Z0000 Encounter for general adult medical examination without abnormal findings: Secondary | ICD-10-CM

## 2019-07-26 MED ORDER — FAMOTIDINE 20 MG PO TABS: 20.0000 mg | ORAL_TABLET | Freq: Two times a day (BID) | ORAL | 1 refills | Status: DC

## 2019-07-26 NOTE — Patient Instructions (Signed)
Tennis elbow support brace - wear for 2-3 weeks, ice elbow at least once a day, and call back if not any better
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
## 2019-08-25 ENCOUNTER — Encounter: Payer: Self-pay | Admitting: *Deleted

## 2019-09-20 ENCOUNTER — Other Ambulatory Visit: Payer: Self-pay

## 2019-09-20 ENCOUNTER — Encounter: Payer: Self-pay | Admitting: Family Medicine

## 2019-09-20 ENCOUNTER — Ambulatory Visit: Payer: BLUE CROSS/BLUE SHIELD | Admitting: Family Medicine

## 2019-09-20 VITALS — BP 132/78 | HR 83 | Temp 98.0°F | Resp 16 | Ht 71.0 in | Wt 163.1 lb

## 2019-09-20 DIAGNOSIS — H1033 Unspecified acute conjunctivitis, bilateral: Secondary | ICD-10-CM | POA: Diagnosis not present

## 2019-09-20 DIAGNOSIS — N529 Male erectile dysfunction, unspecified: Secondary | ICD-10-CM | POA: Diagnosis not present

## 2019-09-20 MED ORDER — TADALAFIL 20 MG PO TABS: 10.0000 mg | ORAL_TABLET | ORAL | 5 refills | Status: DC | PRN
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
## 2019-09-21 ENCOUNTER — Encounter: Payer: Self-pay | Admitting: Family Medicine

## 2019-09-21 DIAGNOSIS — H109 Unspecified conjunctivitis: Secondary | ICD-10-CM

## 2019-09-21 MED ORDER — POLYMYXIN B-TRIMETHOPRIM 10000-0.1 UNIT/ML-% OP SOLN: 1.0000 [drp] | Freq: Four times a day (QID) | OPHTHALMIC | 0 refills | Status: DC

## 2019-12-15 ENCOUNTER — Ambulatory Visit (INDEPENDENT_AMBULATORY_CARE_PROVIDER_SITE_OTHER): Payer: BLUE CROSS/BLUE SHIELD | Admitting: Family Medicine

## 2019-12-15 ENCOUNTER — Other Ambulatory Visit: Payer: Self-pay

## 2019-12-15 ENCOUNTER — Encounter: Payer: Self-pay | Admitting: Family Medicine

## 2019-12-15 VITALS — BP 130/80 | HR 88 | Temp 97.3°F | Resp 18 | Ht 71.0 in | Wt 163.9 lb

## 2019-12-15 DIAGNOSIS — S6992XS Unspecified injury of left wrist, hand and finger(s), sequela: Secondary | ICD-10-CM

## 2019-12-15 DIAGNOSIS — I10 Essential (primary) hypertension: Secondary | ICD-10-CM

## 2019-12-15 MED ORDER — LISINOPRIL 20 MG PO TABS: 20.0000 mg | ORAL_TABLET | Freq: Every day | ORAL | 0 refills | Status: DC

## 2019-12-15 MED ORDER — HYDROCHLOROTHIAZIDE 25 MG PO TABS: 25.0000 mg | ORAL_TABLET | Freq: Every day | ORAL | 0 refills | Status: DC
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
## 2020-03-12 ENCOUNTER — Other Ambulatory Visit: Payer: Self-pay

## 2020-03-12 ENCOUNTER — Encounter: Payer: Self-pay | Admitting: Family Medicine

## 2020-03-12 ENCOUNTER — Ambulatory Visit (INDEPENDENT_AMBULATORY_CARE_PROVIDER_SITE_OTHER): Payer: BLUE CROSS/BLUE SHIELD | Admitting: Family Medicine

## 2020-03-12 VITALS — BP 150/84 | HR 85 | Temp 98.5°F | Resp 14 | Ht 71.0 in | Wt 161.3 lb

## 2020-03-12 DIAGNOSIS — N529 Male erectile dysfunction, unspecified: Secondary | ICD-10-CM

## 2020-03-12 DIAGNOSIS — K219 Gastro-esophageal reflux disease without esophagitis: Secondary | ICD-10-CM | POA: Diagnosis not present

## 2020-03-12 DIAGNOSIS — I10 Essential (primary) hypertension: Secondary | ICD-10-CM

## 2020-03-12 DIAGNOSIS — F17200 Nicotine dependence, unspecified, uncomplicated: Secondary | ICD-10-CM

## 2020-03-12 DIAGNOSIS — R7303 Prediabetes: Secondary | ICD-10-CM | POA: Diagnosis not present

## 2020-03-12 MED ORDER — AMLODIPINE BESYLATE 10 MG PO TABS: 10.0000 mg | ORAL_TABLET | Freq: Every day | ORAL | 3 refills | Status: DC

## 2020-03-12 MED ORDER — LISINOPRIL 20 MG PO TABS: 20.0000 mg | ORAL_TABLET | Freq: Every day | ORAL | 3 refills | Status: DC

## 2020-03-12 MED ORDER — FAMOTIDINE 20 MG PO TABS: 20.0000 mg | ORAL_TABLET | Freq: Two times a day (BID) | ORAL | 1 refills | Status: DC | PRN
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
## 2020-03-12 NOTE — Patient Instructions (Signed)
Keep taking lisinopril daily  Stop HCTZ  Start amlodipine with your lisinopril - this will help control your blood pressure and will not cause urinary side effects.  This should all you to take it more regularly without needing to hold it for you job.    Managing Your Hypertension Hypertension is commonly called high blood pressure. This is when the force of your blood pressing against the walls of your arteries is too strong. Arteries are blood vessels that carry blood from your heart throughout your body. Hypertension forces the heart to work harder to pump blood, and may cause the arteries to become narrow or stiff. Having untreated or uncontrolled hypertension can cause heart attack, stroke, kidney disease, and other problems. What are blood pressure readings? A blood pressure reading consists of a higher number over a lower number. Ideally, your blood pressure should be below 120/80. The first ("top") number is called the systolic pressure. It is a measure of the pressure in your arteries as your heart beats. The second ("bottom") number is called the diastolic pressure. It is a measure of the pressure in your arteries as the heart relaxes. What does my blood pressure reading mean? Blood pressure is classified into four stages. Based on your blood pressure reading, your health care provider may use the following stages to determine what type of treatment you need, if any. Systolic pressure and diastolic pressure are measured in a unit called mm Hg. Normal  Systolic pressure: below 123456.  Diastolic pressure: below 80. Elevated  Systolic pressure: [id number].  Diastolic pressure: below 80. Hypertension stage 1  Systolic pressure: 0000000.  Diastolic pressure: [id number]. Hypertension stage 2  Systolic pressure: [id number] or above.  Diastolic pressure: 90 or above. What health risks are associated with hypertension? Managing your hypertension is an important responsibility. Uncontrolled  hypertension can lead to:  A heart attack.  A stroke.  A weakened blood vessel (aneurysm).  Heart failure.  Kidney damage.  Eye damage.  Metabolic syndrome.  Memory and concentration problems. What changes can I make to manage my hypertension? Hypertension can be managed by making lifestyle changes and possibly by taking medicines. Your health care provider will help you make a plan to bring your blood pressure within a normal range. Eating and drinking   Eat a diet that is high in fiber and potassium, and low in salt (sodium), added sugar, and fat. An example eating plan is called the DASH (Dietary Approaches to Stop Hypertension) diet. To eat this way: ? Eat plenty of fresh fruits and vegetables. Try to fill half of your plate at each meal with fruits and vegetables. ? Eat whole grains, such as whole wheat pasta, brown rice, or whole grain bread. Fill about one quarter of your plate with whole grains. ? Eat low-fat diary products. ? Avoid fatty cuts of meat, processed or cured meats, and poultry with skin. Fill about one quarter of your plate with lean proteins such as fish, chicken without skin, beans, eggs, and tofu. ? Avoid premade and processed foods. These tend to be higher in sodium, added sugar, and fat.  Reduce your daily sodium intake. Most people with hypertension should eat less than 1,500 mg of sodium a day.  Limit alcohol intake to no more than 1 drink a day for nonpregnant women and 2 drinks a day for men. One drink equals 12 oz of beer, 5 oz of wine, or 1 oz of hard liquor. Lifestyle  Work with your health care provider to  maintain a healthy body weight, or to lose weight. Ask what an ideal weight is for you.  Get at least 30 minutes of exercise that causes your heart to beat faster (aerobic exercise) most days of the week. Activities may include walking, swimming, or biking.  Include exercise to strengthen your muscles (resistance exercise), such as weight  lifting, as part of your weekly exercise routine. Try to do these types of exercises for 30 minutes at least 3 days a week.  Do not use any products that contain nicotine or tobacco, such as cigarettes and e-cigarettes. If you need help quitting, ask your health care provider.  Control any long-term (chronic) conditions you have, such as high cholesterol or diabetes. Monitoring  Monitor your blood pressure at home as told by your health care provider. Your personal target blood pressure may vary depending on your medical conditions, your age, and other factors.  Have your blood pressure checked regularly, as often as told by your health care provider. Working with your health care provider  Review all the medicines you take with your health care provider because there may be side effects or interactions.  Talk with your health care provider about your diet, exercise habits, and other lifestyle factors that may be contributing to hypertension.  Visit your health care provider regularly. Your health care provider can help you create and adjust your plan for managing hypertension. Will I need medicine to control my blood pressure? Your health care provider may prescribe medicine if lifestyle changes are not enough to get your blood pressure under control, and if:  Your systolic blood pressure is 130 or higher.  Your diastolic blood pressure is 80 or higher. Take medicines only as told by your health care provider. Follow the directions carefully. Blood pressure medicines must be taken as prescribed. The medicine does not work as well when you skip doses. Skipping doses also puts you at risk for problems. Contact a health care provider if:  You think you are having a reaction to medicines you have taken.  You have repeated (recurrent) headaches.  You feel dizzy.  You have swelling in your ankles.  You have trouble with your vision. Get help right away if:  You develop a severe  headache or confusion.  You have unusual weakness or numbness, or you feel faint.  You have severe pain in your chest or abdomen.  You vomit repeatedly.  You have trouble breathing. Summary  Hypertension is when the force of blood pumping through your arteries is too strong. If this condition is not controlled, it may put you at risk for serious complications.  Your personal target blood pressure may vary depending on your medical conditions, your age, and other factors. For most people, a normal blood pressure is less than 120/80.  Hypertension is managed by lifestyle changes, medicines, or both. Lifestyle changes include weight loss, eating a healthy, low-sodium diet, exercising more, and limiting alcohol. This information is not intended to replace advice given to you by your health care provider. Make sure you discuss any questions you have with your health care provider. Document Revised: 03/25/2019 Document Reviewed: 10/29/2016 Elsevier Patient Education  [employer].

## 2020-03-13 LAB — COMPLETE METABOLIC PANEL WITH GFR
AG Ratio: 1.6 (calc) (ref 1.0–2.5)
ALT: 13 U/L (ref 9–46)
AST: 18 U/L (ref 10–40)
Albumin: 4.5 g/dL (ref 3.6–5.1)
Alkaline phosphatase (APISO): 91 U/L (ref 36–130)
BUN: 12 mg/dL (ref 7–25)
CO2: 33 mmol/L — ABNORMAL HIGH (ref 20–32)
Calcium: 10.1 mg/dL (ref 8.6–10.3)
Chloride: 103 mmol/L (ref 98–110)
Creat: 1.27 mg/dL (ref 0.60–1.35)
GFR, Est African American: 76 mL/min/{1.73_m2} (ref >=60)
GFR, Est Non African American: 66 mL/min/{1.73_m2} (ref >=60)
Globulin: 2.9 g/dL (calc) (ref 1.9–3.7)
Glucose, Bld: 55 mg/dL — ABNORMAL LOW (ref 65–99)
Potassium: 4.5 mmol/L (ref 3.5–5.3)
Sodium: 141 mmol/L (ref 135–146)
Total Bilirubin: 0.3 mg/dL (ref 0.2–1.2)
Total Protein: 7.4 g/dL (ref 6.1–8.1)

## 2020-03-13 LAB — HEMOGLOBIN A1C
Hgb A1c MFr Bld: 5.4 % of total Hgb (ref <5.7)
Mean Plasma Glucose: 108 (calc)
eAG (mmol/L): 6 (calc)

## 2020-03-14 ENCOUNTER — Other Ambulatory Visit: Payer: Self-pay | Admitting: Family Medicine

## 2020-03-14 ENCOUNTER — Encounter: Payer: Self-pay | Admitting: Family Medicine

## 2020-03-14 DIAGNOSIS — E162 Hypoglycemia, unspecified: Secondary | ICD-10-CM

## 2020-03-14 MED ORDER — BLOOD GLUCOSE MONITOR KIT: PACK | 0 refills | Status: AC

## 2020-03-14 NOTE — Progress Notes (Signed)
1. Hypoglycemia Last metabolic panel Lab Results  Component Value Date   GLUCOSE 55 (L) 03/12/2020   NA 141 03/12/2020   K 4.5 03/12/2020   CL 103 03/12/2020   CO2 33 (H) 03/12/2020   BUN 12 03/12/2020   CREATININE 1.27 03/12/2020   GFRNONAA 66 03/12/2020   GFRAA 76 03/12/2020   CALCIUM 10.1 03/12/2020   PROT 7.4 03/12/2020   BILITOT 0.3 03/12/2020   AST 18 03/12/2020   ALT 13 03/12/2020  Patient's blood glucose was low with his recent routine labs and office visit.  He would like to see if he can get a glucometer for monitoring.  Explained to him through staff that his insurance may not cover for hypoglycemia but we can try and send in prescription printed and signed will need to be faxed to his pharmacy.  He may also buy one over-the-counter with more affordable brands available at [facility].   If he is unable to do these things he can also come by the office and recheck BMP or point-of-care CBG. If he continues to have hypoglycemic episodes would refer him to endocrinology for further work-up.  Patient was instructed to monitor when he has symptoms suggestive of hypoglycemia, encouraged him to have high-protein snacks and complex carbs available also have some glucose tablets or packets available just in case he has further episodes.  Patient new to [facility] 1 year ago showed prediabetes with hemoglobin A1c of 5.7 no known past hypoglycemic episodes not on any medications for diabetes.  - blood glucose meter kit and supplies KIT; Dispense based on patient and insurance preference. Use up to four times daily as directed. (FOR ICD-10 E 16.2)  Dispense: 1 each; Refill: 0  Delsa Grana, PA-C

## 2020-04-19 ENCOUNTER — Telehealth (INDEPENDENT_AMBULATORY_CARE_PROVIDER_SITE_OTHER): Payer: BLUE CROSS/BLUE SHIELD | Admitting: Family Medicine

## 2020-04-19 ENCOUNTER — Other Ambulatory Visit: Payer: Self-pay

## 2020-04-19 ENCOUNTER — Encounter: Payer: Self-pay | Admitting: Family Medicine

## 2020-04-19 VITALS — Ht 70.0 in | Wt 163.0 lb

## 2020-04-19 DIAGNOSIS — R350 Frequency of micturition: Secondary | ICD-10-CM | POA: Diagnosis not present

## 2020-04-19 DIAGNOSIS — R7303 Prediabetes: Secondary | ICD-10-CM | POA: Diagnosis not present

## 2020-04-19 DIAGNOSIS — I10 Essential (primary) hypertension: Secondary | ICD-10-CM

## 2020-04-19 DIAGNOSIS — E162 Hypoglycemia, unspecified: Secondary | ICD-10-CM

## 2020-04-19 NOTE — Patient Instructions (Signed)
Lab Results  Component Value Date   HGBA1C 5.4 03/12/2020   No diabetes with last labs.  Urinary frequency can have several other causes, most common in men is from enlarged prostate can also have overactive bladder, or causes from her lifestyle or other substances and chemicals found in over-the-counter medications caffeine etc.  Please schedule appointment to assess this if you continue to have bothersome symptoms, see the handout below for ways that you can work on eliminating things that may be causing or contributing towards her urinary frequency:   Urinary Frequency, Adult Urinary frequency means urinating more often than usual. You may urinate every 1-2 hours even though you drink a normal amount of fluid and do not have a bladder infection or condition. Although you urinate more often than normal, the total amount of urine produced in a day is normal. With urinary frequency, you may have an urgent need to urinate often. The stress and anxiety of needing to find a bathroom quickly can make this urge worse. This condition may go away on its own or you may need treatment at home. Home treatment may include bladder training, exercises, taking medicines, or making changes to your diet. Follow these instructions at home: Bladder health   Keep a bladder diary if told by your health care provider. Keep track of: ? What you eat and drink. ? How often you urinate. ? How much you urinate.  Follow a bladder training program if told by your health care provider. This may include: ? Learning to delay going to the bathroom. ? Double urinating (voiding). This helps if you are not completely emptying your bladder. ? Scheduled voiding.  Do Kegel exercises as told by your health care provider. Kegel exercises strengthen the muscles that help control urination, which may help the condition. Eating and drinking  If told by your health care provider, make diet changes, such as: ? Avoiding  caffeine. ? Drinking fewer fluids, especially alcohol. ? Not drinking in the evening. ? Avoiding foods or drinks that may irritate the bladder. These include coffee, tea, soda, artificial sweeteners, citrus, tomato-based foods, and chocolate. ? Eating foods that help prevent or ease constipation. Constipation can make this condition worse. Your health care provider may recommend that you:  Drink enough fluid to keep your urine pale yellow.  Take over-the-counter or prescription medicines.  Eat foods that are high in fiber, such as beans, whole grains, and fresh fruits and vegetables.  Limit foods that are high in fat and processed sugars, such as fried or sweet foods. General instructions  Take over-the-counter and prescription medicines only as told by your health care provider.  Keep all follow-up visits as told by your health care provider. This is important. Contact a health care provider if:  You start urinating more often.  You feel pain or irritation when you urinate.  You notice blood in your urine.  Your urine looks cloudy.  You develop a fever.  You begin vomiting. Get help right away if:  You are unable to urinate. Summary  Urinary frequency means urinating more often than usual. With urinary frequency, you may urinate every 1-2 hours even though you drink a normal amount of fluid and do not have a bladder infection or other bladder condition.  Your health care provider may recommend that you keep a bladder diary, follow a bladder training program, or make dietary changes.  If told by your health care provider, do Kegel exercises to strengthen the muscles that help  control urination.  Take over-the-counter and prescription medicines only as told by your health care provider.  Contact a health care provider if your symptoms do not improve or get worse. This information is not intended to replace advice given to you by your health care provider. Make sure you  discuss any questions you have with your health care provider. Document Revised: 06/10/2018 Document Reviewed: 06/10/2018 Elsevier Patient Education  [place].   Hypoglycemia Hypoglycemia is when the sugar (glucose) level in your blood is too low. Signs of low blood sugar may include:  Feeling: ? Hungry. ? Worried or nervous (anxious). ? Sweaty and clammy. ? Confused. ? Dizzy. ? Sleepy. ? Sick to your stomach (nauseous).  Having: ? A fast heartbeat. ? A headache. ? A change in your vision. ? Tingling or no feeling (numbness) around your mouth, lips, or tongue. ? Jerky movements that you cannot control (seizure).  Having trouble with: ? Moving (coordination). ? Sleeping. ? Passing out (fainting). ? Getting upset easily (irritability). Low blood sugar can happen to people who have diabetes and people who do not have diabetes. Low blood sugar can happen quickly, and it can be an emergency. Treating low blood sugar Low blood sugar is often treated by eating or drinking something sugary right away, such as:  Fruit juice, 4-6 oz (120-150 mL).  Regular soda (not diet soda), 4-6 oz (120-150 mL).  Low-fat milk, 4 oz (120 mL).  Several pieces of hard candy.  Sugar or honey, 1 Tbsp (15 mL). Treating low blood sugar if you have diabetes If you can think clearly and swallow safely, follow the 15:15 rule:  Take 15 grams of a fast-acting carb (carbohydrate). Talk with your doctor about how much you should take.  Always keep a source of fast-acting carb with you, such as: ? Sugar tablets (glucose pills). Take 3-4 pills. ? 6-8 pieces of hard candy. ? 4-6 oz (120-150 mL) of fruit juice. ? 4-6 oz (120-150 mL) of regular (not diet) soda. ? 1 Tbsp (15 mL) honey or sugar.  Check your blood sugar 15 minutes after you take the carb.  If your blood sugar is still at or below 70 mg/dL (3.9 mmol/L), take 15 grams of a carb again.  If your blood sugar does not go above 70  mg/dL (3.9 mmol/L) after 3 tries, get help right away.  After your blood sugar goes back to normal, eat a meal or a snack within 1 hour.  Treating very low blood sugar If your blood sugar is at or below 54 mg/dL (3 mmol/L), you have very low blood sugar (severe hypoglycemia). This may also cause:  Passing out.  Jerky movements you cannot control (seizure).  Losing consciousness (coma). This is an emergency. Do not wait to see if the symptoms will go away. Get medical help right away. Call your local emergency services (911 in the [place] Do not drive yourself to the hospital. If you have very low blood sugar and you cannot eat or drink, you may need a glucagon shot (injection). A family member or friend should learn how to check your blood sugar and how to give you a glucagon shot. Ask your doctor if you need to have a glucagon shot kit at home. Follow these instructions at home: General instructions  Take over-the-counter and prescription medicines only as told by your doctor.  Stay aware of your blood sugar as told by your doctor.  Limit alcohol intake to no more than 1  drink a day for nonpregnant women and 2 drinks a day for men. One drink equals 12 oz of beer (355 mL), 5 oz of wine (148 mL), or 1 oz of hard liquor (44 mL).  Keep all follow-up visits as told by your doctor. This is important. If you have diabetes:   Follow your diabetes care plan as told by your doctor. Make sure you: ? Know the signs of low blood sugar. ? Take your medicines as told. ? Follow your exercise and meal plan. ? Eat on time. Do not skip meals. ? Check your blood sugar as often as told by your doctor. Always check it before and after exercise. ? Follow your sick day plan when you cannot eat or drink normally. Make this plan ahead of time with your doctor.  Share your diabetes care plan with: ? Your work or school. ? People you live with.  Check your pee (urine) for ketones: ? When you are  sick. ? As told by your doctor.  Carry a card or wear jewelry that says you have diabetes. Contact a doctor if:  You have trouble keeping your blood sugar in your target range.  You have low blood sugar often. Get help right away if:  You still have symptoms after you eat or drink something sugary.  Your blood sugar is at or below 54 mg/dL (3 mmol/L).  You have jerky movements that you cannot control.  You pass out. These symptoms may be an emergency. Do not wait to see if the symptoms will go away. Get medical help right away. Call your local emergency services (911 in the [place] Do not drive yourself to the hospital. Summary  Hypoglycemia happens when the level of sugar (glucose) in your blood is too low.  Low blood sugar can happen to people who have diabetes and people who do not have diabetes. Low blood sugar can happen quickly, and it can be an emergency.  Make sure you know the signs of low blood sugar and know how to treat it.  Always keep a source of sugar (fast-acting carb) with you to treat low blood sugar. This information is not intended to replace advice given to you by your health care provider. Make sure you discuss any questions you have with your health care provider. Document Revised: 03/24/2019 Document Reviewed: 01/04/2016 Elsevier Patient Education  [place] DASH stands for "Dietary Approaches to Stop Hypertension." The DASH eating plan is a healthy eating plan that has been shown to reduce high blood pressure (hypertension). It may also reduce your risk for type 2 diabetes, heart disease, and stroke. The DASH eating plan may also help with weight loss. What are tips for following this plan?  General guidelines  Avoid eating more than 2,300 mg (milligrams) of salt (sodium) a day. If you have hypertension, you may need to reduce your sodium intake to 1,500 mg a day.  Limit alcohol intake to no more than 1 drink a day for  nonpregnant women and 2 drinks a day for men. One drink equals 12 oz of beer, 5 oz of wine, or 1 oz of hard liquor.  Work with your health care provider to maintain a healthy body weight or to lose weight. Ask what an ideal weight is for you.  Get at least 30 minutes of exercise that causes your heart to beat faster (aerobic exercise) most days of the week. Activities may include walking, swimming, or biking.  Work with your health care provider or diet and nutrition specialist (dietitian) to adjust your eating plan to your individual calorie needs. Reading food labels   Check food labels for the amount of sodium per serving. Choose foods with less than 5 percent of the Daily Value of sodium. Generally, foods with less than 300 mg of sodium per serving fit into this eating plan.  To find whole grains, look for the word "whole" as the first word in the ingredient list. Shopping  Buy products labeled as "low-sodium" or "no salt added."  Buy fresh foods. Avoid canned foods and premade or frozen meals. Cooking  Avoid adding salt when cooking. Use salt-free seasonings or herbs instead of table salt or sea salt. Check with your health care provider or pharmacist before using salt substitutes.  Do not fry foods. Cook foods using healthy methods such as baking, boiling, grilling, and broiling instead.  Cook with heart-healthy oils, such as olive, canola, soybean, or sunflower oil. Meal planning  Eat a balanced diet that includes: ? 5 or more servings of fruits and vegetables each day. At each meal, try to fill half of your plate with fruits and vegetables. ? Up to 6-8 servings of whole grains each day. ? Less than 6 oz of lean meat, poultry, or fish each day. A 3-oz serving of meat is about the same size as a deck of cards. One egg equals 1 oz. ? 2 servings of low-fat dairy each day. ? A serving of nuts, seeds, or beans 5 times each week. ? Heart-healthy fats. Healthy fats called Omega-3  fatty acids are found in foods such as flaxseeds and coldwater fish, like sardines, salmon, and mackerel.  Limit how much you eat of the following: ? Canned or prepackaged foods. ? Food that is high in trans fat, such as fried foods. ? Food that is high in saturated fat, such as fatty meat. ? Sweets, desserts, sugary drinks, and other foods with added sugar. ? Full-fat dairy products.  Do not salt foods before eating.  Try to eat at least 2 vegetarian meals each week.  Eat more home-cooked food and less restaurant, buffet, and fast food.  When eating at a restaurant, ask that your food be prepared with less salt or no salt, if possible. What foods are recommended? The items listed may not be a complete list. Talk with your dietitian about what dietary choices are best for you. Grains Whole-grain or whole-wheat bread. Whole-grain or whole-wheat pasta. Brown rice. [place]. Bulgur. Whole-grain and low-sodium cereals. Pita bread. Low-fat, low-sodium crackers. Whole-wheat flour tortillas. Vegetables Fresh or frozen vegetables (raw, steamed, roasted, or grilled). Low-sodium or reduced-sodium tomato and vegetable juice. Low-sodium or reduced-sodium tomato sauce and tomato paste. Low-sodium or reduced-sodium canned vegetables. Fruits All fresh, dried, or frozen fruit. Canned fruit in natural juice (without added sugar). Meat and other protein foods Skinless chicken or [place]. Ground chicken or [place]. Pork with fat trimmed off. Fish and seafood. Egg whites. Dried beans, peas, or lentils. Unsalted nuts, nut butters, and seeds. Unsalted canned beans. Lean cuts of beef with fat trimmed off. Low-sodium, lean deli meat. Dairy Low-fat (1%) or fat-free (skim) milk. Fat-free, low-fat, or reduced-fat cheeses. Nonfat, low-sodium ricotta or cottage cheese. Low-fat or nonfat yogurt. Low-fat, low-sodium cheese. Fats and oils Soft margarine without trans fats. Vegetable oil. Low-fat, reduced-fat, or  light mayonnaise and salad dressings (reduced-sodium). Canola, safflower, olive, soybean, and sunflower oils. Avocado. Seasoning and other foods Herbs. Spices. Seasoning mixes  without salt. Unsalted popcorn and pretzels. Fat-free sweets. What foods are not recommended? The items listed may not be a complete list. Talk with your dietitian about what dietary choices are best for you. Grains Baked goods made with fat, such as croissants, muffins, or some breads. Dry pasta or rice meal packs. Vegetables Creamed or fried vegetables. Vegetables in a cheese sauce. Regular canned vegetables (not low-sodium or reduced-sodium). Regular canned tomato sauce and paste (not low-sodium or reduced-sodium). Regular tomato and vegetable juice (not low-sodium or reduced-sodium). Angie Fava. Olives. Fruits Canned fruit in a light or heavy syrup. Fried fruit. Fruit in cream or butter sauce. Meat and other protein foods Fatty cuts of meat. Ribs. Fried meat. Berniece Salines. Sausage. Bologna and other processed lunch meats. Salami. Fatback. Hotdogs. Bratwurst. Salted nuts and seeds. Canned beans with added salt. Canned or smoked fish. Whole eggs or egg yolks. Chicken or [place] with skin. Dairy Whole or 2% milk, cream, and half-and-half. Whole or full-fat cream cheese. Whole-fat or sweetened yogurt. Full-fat cheese. Nondairy creamers. Whipped toppings. Processed cheese and cheese spreads. Fats and oils Butter. Stick margarine. Lard. Shortening. Ghee. Bacon fat. Tropical oils, such as coconut, palm kernel, or palm oil. Seasoning and other foods Salted popcorn and pretzels. Onion salt, garlic salt, seasoned salt, table salt, and sea salt. Worcestershire sauce. Tartar sauce. Barbecue sauce. Teriyaki sauce. Soy sauce, including reduced-sodium. Steak sauce. Canned and packaged gravies. Fish sauce. Oyster sauce. Cocktail sauce. Horseradish that you find on the shelf. Ketchup. Mustard. Meat flavorings and tenderizers. Bouillon cubes. Hot  sauce and Tabasco sauce. Premade or packaged marinades. Premade or packaged taco seasonings. Relishes. Regular salad dressings. Where to find more information:  National Heart, [facility]: https://wilson-eaton.com/  American Heart Association: www.heart.org Summary  The DASH eating plan is a healthy eating plan that has been shown to reduce high blood pressure (hypertension). It may also reduce your risk for type 2 diabetes, heart disease, and stroke.  With the DASH eating plan, you should limit salt (sodium) intake to 2,300 mg a day. If you have hypertension, you may need to reduce your sodium intake to 1,500 mg a day.  When on the DASH eating plan, aim to eat more fresh fruits and vegetables, whole grains, lean proteins, low-fat dairy, and heart-healthy fats.  Work with your health care provider or diet and nutrition specialist (dietitian) to adjust your eating plan to your individual calorie needs. This information is not intended to replace advice given to you by your health care provider. Make sure you discuss any questions you have with your health care provider. Document Revised: 11/13/2017 Document Reviewed: 11/24/2016 Elsevier Patient Education  2020 [employer].
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
## 2020-04-20 ENCOUNTER — Ambulatory Visit: Payer: BLUE CROSS/BLUE SHIELD

## 2020-04-20 ENCOUNTER — Other Ambulatory Visit: Payer: Self-pay

## 2020-04-20 VITALS — BP 132/80 | HR 85 | Temp 97.9°F | Resp 14 | Ht 72.0 in | Wt 166.3 lb

## 2020-04-20 DIAGNOSIS — I10 Essential (primary) hypertension: Secondary | ICD-10-CM

## 2020-04-20 NOTE — Progress Notes (Signed)
VS done today to go with Virtual visit yesterday

## 2020-05-01 ENCOUNTER — Other Ambulatory Visit: Payer: Self-pay

## 2020-05-01 DIAGNOSIS — I10 Essential (primary) hypertension: Secondary | ICD-10-CM

## 2020-05-01 DIAGNOSIS — K219 Gastro-esophageal reflux disease without esophagitis: Secondary | ICD-10-CM

## 2020-05-01 MED ORDER — FAMOTIDINE 20 MG PO TABS: 20.0000 mg | ORAL_TABLET | Freq: Two times a day (BID) | ORAL | 1 refills | Status: DC | PRN

## 2020-05-01 MED ORDER — LISINOPRIL 20 MG PO TABS: 20.0000 mg | ORAL_TABLET | Freq: Every day | ORAL | 3 refills | Status: DC

## 2020-05-01 MED ORDER — AMLODIPINE BESYLATE 10 MG PO TABS: 10.0000 mg | ORAL_TABLET | Freq: Every day | ORAL | 3 refills | Status: DC

## 2020-05-15 ENCOUNTER — Encounter: Payer: Self-pay | Admitting: Family Medicine

## 2020-05-16 NOTE — Telephone Encounter (Signed)
BP high, have him come in for OV - if not appts have him come in for nurse BP recheck - bring his meter to compare

## 2020-05-16 NOTE — Telephone Encounter (Signed)
BP high - if his BP remains >160/100 he needs an appt here ASAP If he has high BP and concerning sx - he needs to go to UC or ER for eval (ie CP SOB, HA, vision changes, LE edema)

## 2020-06-27 DIAGNOSIS — Z20822 Contact with and (suspected) exposure to covid-19: Secondary | ICD-10-CM | POA: Diagnosis not present
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
## 2020-08-27 ENCOUNTER — Ambulatory Visit (INDEPENDENT_AMBULATORY_CARE_PROVIDER_SITE_OTHER): Payer: BLUE CROSS/BLUE SHIELD | Admitting: Family Medicine

## 2020-08-27 ENCOUNTER — Encounter: Payer: Self-pay | Admitting: Family Medicine

## 2020-08-27 ENCOUNTER — Other Ambulatory Visit: Payer: Self-pay

## 2020-08-27 VITALS — BP 132/88 | HR 77 | Temp 98.3°F | Resp 18 | Ht 72.0 in | Wt 159.1 lb

## 2020-08-27 DIAGNOSIS — L309 Dermatitis, unspecified: Secondary | ICD-10-CM | POA: Diagnosis not present

## 2020-08-27 DIAGNOSIS — I10 Essential (primary) hypertension: Secondary | ICD-10-CM | POA: Diagnosis not present

## 2020-08-27 DIAGNOSIS — E162 Hypoglycemia, unspecified: Secondary | ICD-10-CM

## 2020-08-27 DIAGNOSIS — E785 Hyperlipidemia, unspecified: Secondary | ICD-10-CM | POA: Diagnosis not present

## 2020-08-27 DIAGNOSIS — Z5181 Encounter for therapeutic drug level monitoring: Secondary | ICD-10-CM

## 2020-08-27 DIAGNOSIS — N529 Male erectile dysfunction, unspecified: Secondary | ICD-10-CM

## 2020-08-27 DIAGNOSIS — R7303 Prediabetes: Secondary | ICD-10-CM | POA: Diagnosis not present

## 2020-08-27 DIAGNOSIS — Q5561 Curvature of penis (lateral): Secondary | ICD-10-CM

## 2020-08-27 DIAGNOSIS — F1010 Alcohol abuse, uncomplicated: Secondary | ICD-10-CM

## 2020-08-27 DIAGNOSIS — K219 Gastro-esophageal reflux disease without esophagitis: Secondary | ICD-10-CM

## 2020-08-27 NOTE — Patient Instructions (Signed)
Hypoglycemia Hypoglycemia is when the sugar (glucose) level in your blood is too low. Signs of low blood sugar may include:  Feeling: ? Hungry. ? Worried or nervous (anxious). ? Sweaty and clammy. ? Confused. ? Dizzy. ? Sleepy. ? Sick to your stomach (nauseous).  Having: ? A fast heartbeat. ? A headache. ? A change in your vision. ? Tingling or no feeling (numbness) around your mouth, lips, or tongue. ? Jerky movements that you cannot control (seizure).  Having trouble with: ? Moving (coordination). ? Sleeping. ? Passing out (fainting). ? Getting upset easily (irritability). Low blood sugar can happen to people who have diabetes and people who do not have diabetes. Low blood sugar can happen quickly, and it can be an emergency. Treating low blood sugar Low blood sugar is often treated by eating or drinking something sugary right away, such as:  Fruit juice, 4-6 oz (120-150 mL).  Regular soda (not diet soda), 4-6 oz (120-150 mL).  Low-fat milk, 4 oz (120 mL).  Several pieces of hard candy.  Sugar or honey, 1 Tbsp (15 mL). Treating low blood sugar if you have diabetes If you can think clearly and swallow safely, follow the 15:15 rule:  Take 15 grams of a fast-acting carb (carbohydrate). Talk with your doctor about how much you should take.  Always keep a source of fast-acting carb with you, such as: ? Sugar tablets (glucose pills). Take 3-4 pills. ? 6-8 pieces of hard candy. ? 4-6 oz (120-150 mL) of fruit juice. ? 4-6 oz (120-150 mL) of regular (not diet) soda. ? 1 Tbsp (15 mL) honey or sugar.  Check your blood sugar 15 minutes after you take the carb.  If your blood sugar is still at or below 70 mg/dL (3.9 mmol/L), take 15 grams of a carb again.  If your blood sugar does not go above 70 mg/dL (3.9 mmol/L) after 3 tries, get help right away.  After your blood sugar goes back to normal, eat a meal or a snack within 1 hour.  Treating very low blood sugar If your  blood sugar is at or below 54 mg/dL (3 mmol/L), you have very low blood sugar (severe hypoglycemia). This may also cause:  Passing out.  Jerky movements you cannot control (seizure).  Losing consciousness (coma). This is an emergency. Do not wait to see if the symptoms will go away. Get medical help right away. Call your local emergency services (911 in the [place] Do not drive yourself to the hospital. If you have very low blood sugar and you cannot eat or drink, you may need a glucagon shot (injection). A family member or friend should learn how to check your blood sugar and how to give you a glucagon shot. Ask your doctor if you need to have a glucagon shot kit at home. Follow these instructions at home: General instructions  Take over-the-counter and prescription medicines only as told by your doctor.  Stay aware of your blood sugar as told by your doctor.  Limit alcohol intake to no more than 1 drink a day for nonpregnant women and 2 drinks a day for men. One drink equals 12 oz of beer (355 mL), 5 oz of wine (148 mL), or 1 oz of hard liquor (44 mL).  Keep all follow-up visits as told by your doctor. This is important. If you have diabetes:   Follow your diabetes care plan as told by your doctor. Make sure you: ? Know the signs of low blood sugar. ?  Take your medicines as told. ? Follow your exercise and meal plan. ? Eat on time. Do not skip meals. ? Check your blood sugar as often as told by your doctor. Always check it before and after exercise. ? Follow your sick day plan when you cannot eat or drink normally. Make this plan ahead of time with your doctor.  Share your diabetes care plan with: ? Your work or school. ? People you live with.  Check your pee (urine) for ketones: ? When you are sick. ? As told by your doctor.  Carry a card or wear jewelry that says you have diabetes. Contact a doctor if:  You have trouble keeping your blood sugar in your target  range.  You have low blood sugar often. Get help right away if:  You still have symptoms after you eat or drink something sugary.  Your blood sugar is at or below 54 mg/dL (3 mmol/L).  You have jerky movements that you cannot control.  You pass out. These symptoms may be an emergency. Do not wait to see if the symptoms will go away. Get medical help right away. Call your local emergency services (911 in the [place] Do not drive yourself to the hospital. Summary  Hypoglycemia happens when the level of sugar (glucose) in your blood is too low.  Low blood sugar can happen to people who have diabetes and people who do not have diabetes. Low blood sugar can happen quickly, and it can be an emergency.  Make sure you know the signs of low blood sugar and know how to treat it.  Always keep a source of sugar (fast-acting carb) with you to treat low blood sugar. This information is not intended to replace advice given to you by your health care provider. Make sure you discuss any questions you have with your health care provider. Document Revised: 03/24/2019 Document Reviewed: 01/04/2016 Elsevier Patient Education  2020 [employer].    Managing Your Hypertension Hypertension is commonly called high blood pressure. This is when the force of your blood pressing against the walls of your arteries is too strong. Arteries are blood vessels that carry blood from your heart throughout your body. Hypertension forces the heart to work harder to pump blood, and may cause the arteries to become narrow or stiff. Having untreated or uncontrolled hypertension can cause heart attack, stroke, kidney disease, and other problems. What are blood pressure readings? A blood pressure reading consists of a higher number over a lower number. Ideally, your blood pressure should be below 120/80. The first ("top") number is called the systolic pressure. It is a measure of the pressure in your arteries as your heart  beats. The second ("bottom") number is called the diastolic pressure. It is a measure of the pressure in your arteries as the heart relaxes. What does my blood pressure reading mean? Blood pressure is classified into four stages. Based on your blood pressure reading, your health care provider may use the following stages to determine what type of treatment you need, if any. Systolic pressure and diastolic pressure are measured in a unit called mm Hg. Normal  Systolic pressure: below 478.  Diastolic pressure: below 80. Elevated  Systolic pressure: 295-621.  Diastolic pressure: below 80. Hypertension stage 1  Systolic pressure: 308-657.  Diastolic pressure: 84-69. Hypertension stage 2  Systolic pressure: 629 or above.  Diastolic pressure: 90 or above. What health risks are associated with hypertension? Managing your hypertension is an important responsibility. Uncontrolled hypertension  can lead to:  A heart attack.  A stroke.  A weakened blood vessel (aneurysm).  Heart failure.  Kidney damage.  Eye damage.  Metabolic syndrome.  Memory and concentration problems. What changes can I make to manage my hypertension? Hypertension can be managed by making lifestyle changes and possibly by taking medicines. Your health care provider will help you make a plan to bring your blood pressure within a normal range. Eating and drinking   Eat a diet that is high in fiber and potassium, and low in salt (sodium), added sugar, and fat. An example eating plan is called the DASH (Dietary Approaches to Stop Hypertension) diet. To eat this way: ? Eat plenty of fresh fruits and vegetables. Try to fill half of your plate at each meal with fruits and vegetables. ? Eat whole grains, such as whole wheat pasta, brown rice, or whole grain bread. Fill about one quarter of your plate with whole grains. ? Eat low-fat diary products. ? Avoid fatty cuts of meat, processed or cured meats, and poultry  with skin. Fill about one quarter of your plate with lean proteins such as fish, chicken without skin, beans, eggs, and tofu. ? Avoid premade and processed foods. These tend to be higher in sodium, added sugar, and fat.  Reduce your daily sodium intake. Most people with hypertension should eat less than 1,500 mg of sodium a day.  Limit alcohol intake to no more than 1 drink a day for nonpregnant women and 2 drinks a day for men. One drink equals 12 oz of beer, 5 oz of wine, or 1 oz of hard liquor. Lifestyle  Work with your health care provider to maintain a healthy body weight, or to lose weight. Ask what an ideal weight is for you.  Get at least 30 minutes of exercise that causes your heart to beat faster (aerobic exercise) most days of the week. Activities may include walking, swimming, or biking.  Include exercise to strengthen your muscles (resistance exercise), such as weight lifting, as part of your weekly exercise routine. Try to do these types of exercises for 30 minutes at least 3 days a week.  Do not use any products that contain nicotine or tobacco, such as cigarettes and e-cigarettes. If you need help quitting, ask your health care provider.  Control any long-term (chronic) conditions you have, such as high cholesterol or diabetes. Monitoring  Monitor your blood pressure at home as told by your health care provider. Your personal target blood pressure may vary depending on your medical conditions, your age, and other factors.  Have your blood pressure checked regularly, as often as told by your health care provider. Working with your health care provider  Review all the medicines you take with your health care provider because there may be side effects or interactions.  Talk with your health care provider about your diet, exercise habits, and other lifestyle factors that may be contributing to hypertension.  Visit your health care provider regularly. Your health care provider  can help you create and adjust your plan for managing hypertension. Will I need medicine to control my blood pressure? Your health care provider may prescribe medicine if lifestyle changes are not enough to get your blood pressure under control, and if:  Your systolic blood pressure is 130 or higher.  Your diastolic blood pressure is 80 or higher. Take medicines only as told by your health care provider. Follow the directions carefully. Blood pressure medicines must be taken as prescribed.  The medicine does not work as well when you skip doses. Skipping doses also puts you at risk for problems. Contact a health care provider if:  You think you are having a reaction to medicines you have taken.  You have repeated (recurrent) headaches.  You feel dizzy.  You have swelling in your ankles.  You have trouble with your vision. Get help right away if:  You develop a severe headache or confusion.  You have unusual weakness or numbness, or you feel faint.  You have severe pain in your chest or abdomen.  You vomit repeatedly.  You have trouble breathing. Summary  Hypertension is when the force of blood pumping through your arteries is too strong. If this condition is not controlled, it may put you at risk for serious complications.  Your personal target blood pressure may vary depending on your medical conditions, your age, and other factors. For most people, a normal blood pressure is less than 120/80.  Hypertension is managed by lifestyle changes, medicines, or both. Lifestyle changes include weight loss, eating a healthy, low-sodium diet, exercising more, and limiting alcohol. This information is not intended to replace advice given to you by your health care provider. Make sure you discuss any questions you have with your health care provider. Document Revised: 03/25/2019 Document Reviewed: 10/29/2016 Elsevier Patient Education  [employer].

## 2020-08-29 LAB — CBC WITH DIFFERENTIAL/PLATELET
Absolute Monocytes: 598 cells/uL (ref 200–950)
Basophils Absolute: 49 cells/uL (ref 0–200)
Basophils Relative: 0.8 %
Eosinophils Absolute: 214 cells/uL (ref 15–500)
Eosinophils Relative: 3.5 %
HCT: 48.2 % (ref 38.5–50.0)
Hemoglobin: 16.3 g/dL (ref 13.2–17.1)
Lymphs Abs: 1684 cells/uL (ref 850–3900)
MCH: 31.5 pg (ref 27.0–33.0)
MCHC: 33.8 g/dL (ref 32.0–36.0)
MCV: 93.2 fL (ref 80.0–100.0)
MPV: 10.6 fL (ref 7.5–12.5)
Monocytes Relative: 9.8 %
Neutro Abs: 3556 cells/uL (ref 1500–7800)
Neutrophils Relative %: 58.3 %
Platelets: 213 10*3/uL (ref 140–400)
RBC: 5.17 10*6/uL (ref 4.20–5.80)
RDW: 12.2 % (ref 11.0–15.0)
Total Lymphocyte: 27.6 %
WBC: 6.1 10*3/uL (ref 3.8–10.8)

## 2020-08-29 LAB — COMPLETE METABOLIC PANEL WITH GFR
AG Ratio: 1.8 (calc) (ref 1.0–2.5)
ALT: 12 U/L (ref 9–46)
AST: 19 U/L (ref 10–40)
Albumin: 4.8 g/dL (ref 3.6–5.1)
Alkaline phosphatase (APISO): 87 U/L (ref 36–130)
BUN: 12 mg/dL (ref 7–25)
CO2: 31 mmol/L (ref 20–32)
Calcium: 9.9 mg/dL (ref 8.6–10.3)
Chloride: 98 mmol/L (ref 98–110)
Creat: 1.28 mg/dL (ref 0.60–1.35)
GFR, Est African American: 76 mL/min/{1.73_m2} (ref >=60)
GFR, Est Non African American: 65 mL/min/{1.73_m2} (ref >=60)
Globulin: 2.7 g/dL (calc) (ref 1.9–3.7)
Glucose, Bld: 83 mg/dL (ref 65–99)
Potassium: 4.3 mmol/L (ref 3.5–5.3)
Sodium: 137 mmol/L (ref 135–146)
Total Bilirubin: 0.5 mg/dL (ref 0.2–1.2)
Total Protein: 7.5 g/dL (ref 6.1–8.1)

## 2020-08-29 LAB — HEMOGLOBIN A1C
Hgb A1c MFr Bld: 5.5 % of total Hgb (ref <5.7)
Mean Plasma Glucose: 111 (calc)
eAG (mmol/L): 6.2 (calc)

## 2020-08-29 LAB — LIPID PANEL
Cholesterol: 227 mg/dL — ABNORMAL HIGH (ref <200)
HDL: 102 mg/dL (ref >=40)
LDL Cholesterol (Calc): 110 mg/dL (calc) — ABNORMAL HIGH
Non-HDL Cholesterol (Calc): 125 mg/dL (calc) (ref <130)
Total CHOL/HDL Ratio: 2.2 (calc) (ref <5.0)
Triglycerides: 64 mg/dL (ref <150)

## 2020-09-05 NOTE — Progress Notes (Signed)
09/06/2020 2:59 PM   Calvin Taylor 05-Jan-1971 [id number]  Referring provider: Delsa Grana, PA-C [address] [place],  [place] [address] Chief Complaint  Patient presents with  . Abnormal Penile Curvature    New Patient    HPI: Calvin Taylor is a 49 y.o. male who is seen today at the request of Leisa Tapia-PA-C for evaluation and management of penile curvature.   -3 week history of penile curvature -Curvature left distal shaft estimated between 45-90 degrees -Denies pain with erections. Curvature does not effect sexual intercourse. -No history of trauma, penile fracture, Dupuytren's contracture -Denies erectile dysfunction   PMH: Past Medical History:  Diagnosis Date  . Hypertension     Surgical History: Past Surgical History:  Procedure Laterality Date  . right eye surgery      Home Medications:  Allergies as of 09/06/2020   No Known Allergies     Medication List       Accurate as of September 06, 2020  2:59 PM. If you have any questions, ask your nurse or doctor.        amLODipine 10 MG tablet Commonly known as: NORVASC Take 1 tablet (10 mg total) by mouth daily.   blood glucose meter kit and supplies Kit Dispense based on patient and insurance preference. Use up to four times daily as directed. (FOR ICD-10 E 16.2)   famotidine 20 MG tablet Commonly known as: Pepcid Take 1 tablet (20 mg total) by mouth 2 (two) times daily as needed for heartburn or indigestion.   lisinopril 20 MG tablet Commonly known as: ZESTRIL Take 1 tablet (20 mg total) by mouth daily.   loratadine 10 MG tablet Commonly known as: Claritin Take 1 tablet (10 mg total) by mouth daily.   tadalafil 20 MG tablet Commonly known as: CIALIS Take 0.5-1 tablets (10-20 mg total) by mouth every other day as needed for erectile dysfunction.   trimethoprim-polymyxin b ophthalmic solution Commonly known as: POLYTRIM Place 1 drop into both eyes every 6 (six) hours.        Allergies: No Known Allergies  Family History: Family History  Problem Relation Age of Onset  . Hypertension Mother   . Hypertension Father     Social History:  reports that he has been smoking cigarettes. He has been smoking about 0.50 packs per day. He has never used smokeless tobacco. He reports current alcohol use of about 21.0 standard drinks of alcohol per week. He reports that he does not use drugs.   Physical Exam: BP (!) 146/83   Pulse 77   Ht 5' 10"  (1.778 m)   Wt 160 lb (72.6 kg)   BMI 22.96 kg/m   Constitutional:  Alert and oriented, No acute distress. HEENT: [place] AT, moist mucus membranes.  Trachea midline, no masses. Cardiovascular: No clubbing, cyanosis, or edema. Respiratory: Normal respiratory effort, no increased work of breathing. GU: No CVA tenderness.  No bladder fullness or masses.  Patient with circumcised phallus. ~ 1 cm distal plaque left lateral corpus distally. Urethral meatus is patent.  No penile discharge. No penile lesions or rashes. Scrotum without lesions, cysts, rashes and/or edema.  Testicles are located scrotally bilaterally. No masses are appreciated in the testicles. Left and right epididymis are normal. Skin: No rashes, bruises or suspicious lesions. Neurologic: Grossly intact, no focal deficits, moving all 4 extremities. Psychiatric: Normal mood and affect.   Assessment & Plan:    1. Peyronie's disease Based on patient's history of physical exam, findings  are consistent with Peyronie's disease.   We discussed acute and chronic phases of Peyronie's disease.  Though he has no pain he first noted curvature 3 weeks ago placing him in the acute phase.  We discussed treatment options are reserved for chronic phase Peyronie's with stable curvature.  Options include observation, penile plaque and graft, penile plication,and injection of collagenase were all reviewed.   Although there is no approved medication for Peyronie's disease we discussed  the off label use of pentoxifylline x90 days which he has elected to pursue.  Rx sent  Follow up in 3 months for symptoms recheck.   [facility] [address] [place], [place] [address] [phone]  I, Selena Batten, am acting as a scribe for Dr. Nicki Reaper C. Marin Wisner,  I have reviewed the above documentation for accuracy and completeness, and I agree with the above.    Abbie Sons, MD

## 2020-09-06 ENCOUNTER — Encounter: Payer: Self-pay | Admitting: Urology

## 2020-09-06 ENCOUNTER — Ambulatory Visit (INDEPENDENT_AMBULATORY_CARE_PROVIDER_SITE_OTHER): Payer: BLUE CROSS/BLUE SHIELD | Admitting: Urology

## 2020-09-06 ENCOUNTER — Other Ambulatory Visit: Payer: Self-pay

## 2020-09-06 VITALS — BP 146/83 | HR 77 | Ht 70.0 in | Wt 160.0 lb

## 2020-09-06 DIAGNOSIS — N486 Induration penis plastica: Secondary | ICD-10-CM | POA: Diagnosis not present

## 2020-09-06 MED ORDER — PENTOXIFYLLINE ER 400 MG PO TBCR: 400.0000 mg | EXTENDED_RELEASE_TABLET | Freq: Two times a day (BID) | ORAL | 2 refills | Status: DC

## 2020-12-10 ENCOUNTER — Ambulatory Visit: Payer: Self-pay | Admitting: Urology

## 2021-01-01 ENCOUNTER — Other Ambulatory Visit: Payer: Self-pay

## 2021-01-01 DIAGNOSIS — N529 Male erectile dysfunction, unspecified: Secondary | ICD-10-CM

## 2021-01-01 MED ORDER — TADALAFIL 20 MG PO TABS: 10.0000 mg | ORAL_TABLET | ORAL | 0 refills | Status: DC | PRN

## 2021-01-14 ENCOUNTER — Ambulatory Visit: Payer: Self-pay | Attending: Internal Medicine

## 2021-01-14 ENCOUNTER — Other Ambulatory Visit: Payer: Self-pay

## 2021-01-14 DIAGNOSIS — Z23 Encounter for immunization: Secondary | ICD-10-CM

## 2021-01-14 NOTE — Progress Notes (Signed)
   Covid-19 Vaccination Clinic  Name:  Calvin Taylor    MRN: [id number] DOB: 21-May-1971  01/14/2021  Mr. [name] was observed post Covid-19 immunization for 15 minutes without incident. He was provided with Vaccine Information Sheet and instruction to access the V-Safe system.   Mr. [name] was instructed to call 911 with any severe reactions post vaccine: [name] Kitchen Difficulty breathing  . Swelling of face and throat  . A fast heartbeat  . A bad rash all over body  . Dizziness and weakness   Immunizations Administered    Name Date Dose VIS Date Route   PFIZER Comrnaty(Gray TOP) Covid-19 Vaccine 01/14/2021  1:30 PM 0.3 mL 11/22/2020 Intramuscular   Manufacturer: [employer]   Lot: [id number]   NDC: [phone]

## 2021-02-04 ENCOUNTER — Ambulatory Visit: Payer: Self-pay

## 2021-02-05 ENCOUNTER — Ambulatory Visit: Payer: Self-pay

## 2021-02-25 ENCOUNTER — Ambulatory Visit: Payer: BLUE CROSS/BLUE SHIELD | Admitting: Family Medicine

## 2021-02-26 DIAGNOSIS — Z20822 Contact with and (suspected) exposure to covid-19: Secondary | ICD-10-CM | POA: Diagnosis not present

## 2021-02-26 DIAGNOSIS — M791 Myalgia, unspecified site: Secondary | ICD-10-CM | POA: Diagnosis not present

## 2021-02-26 DIAGNOSIS — R0982 Postnasal drip: Secondary | ICD-10-CM | POA: Diagnosis not present

## 2021-03-18 DIAGNOSIS — Z23 Encounter for immunization: Secondary | ICD-10-CM | POA: Diagnosis not present

## 2021-03-18 DIAGNOSIS — S61239A Puncture wound without foreign body of unspecified finger without damage to nail, initial encounter: Secondary | ICD-10-CM | POA: Diagnosis not present

## 2021-03-25 ENCOUNTER — Ambulatory Visit (INDEPENDENT_AMBULATORY_CARE_PROVIDER_SITE_OTHER): Payer: BC Managed Care – PPO | Admitting: Family Medicine

## 2021-03-25 ENCOUNTER — Other Ambulatory Visit: Payer: Self-pay

## 2021-03-25 ENCOUNTER — Encounter: Payer: Self-pay | Admitting: Family Medicine

## 2021-03-25 VITALS — BP 134/88 | HR 96 | Temp 98.2°F | Resp 18 | Ht 70.0 in | Wt 162.3 lb

## 2021-03-25 DIAGNOSIS — I10 Essential (primary) hypertension: Secondary | ICD-10-CM | POA: Diagnosis not present

## 2021-03-25 DIAGNOSIS — Z5181 Encounter for therapeutic drug level monitoring: Secondary | ICD-10-CM

## 2021-03-25 DIAGNOSIS — L219 Seborrheic dermatitis, unspecified: Secondary | ICD-10-CM

## 2021-03-25 DIAGNOSIS — K219 Gastro-esophageal reflux disease without esophagitis: Secondary | ICD-10-CM | POA: Diagnosis not present

## 2021-03-25 DIAGNOSIS — N529 Male erectile dysfunction, unspecified: Secondary | ICD-10-CM

## 2021-03-25 DIAGNOSIS — E785 Hyperlipidemia, unspecified: Secondary | ICD-10-CM

## 2021-03-25 DIAGNOSIS — Z1211 Encounter for screening for malignant neoplasm of colon: Secondary | ICD-10-CM

## 2021-03-25 DIAGNOSIS — F17200 Nicotine dependence, unspecified, uncomplicated: Secondary | ICD-10-CM

## 2021-03-25 DIAGNOSIS — H1013 Acute atopic conjunctivitis, bilateral: Secondary | ICD-10-CM

## 2021-03-25 DIAGNOSIS — F17210 Nicotine dependence, cigarettes, uncomplicated: Secondary | ICD-10-CM | POA: Insufficient documentation

## 2021-03-25 MED ORDER — CROMOLYN SODIUM 4 % OP SOLN: 1.0000 [drp] | Freq: Four times a day (QID) | OPHTHALMIC | 2 refills | Status: DC

## 2021-03-25 MED ORDER — TADALAFIL 20 MG PO TABS: 20.0000 mg | ORAL_TABLET | ORAL | 3 refills | Status: DC | PRN

## 2021-03-25 MED ORDER — FAMOTIDINE 20 MG PO TABS: 20.0000 mg | ORAL_TABLET | Freq: Two times a day (BID) | ORAL | 1 refills | Status: DC | PRN

## 2021-03-25 MED ORDER — LISINOPRIL 20 MG PO TABS: 20.0000 mg | ORAL_TABLET | Freq: Every day | ORAL | 3 refills | Status: DC

## 2021-03-25 MED ORDER — KETOCONAZOLE 2 % EX SHAM: 1.0000 "application " | MEDICATED_SHAMPOO | CUTANEOUS | 2 refills | Status: DC

## 2021-03-25 MED ORDER — AMLODIPINE BESYLATE 10 MG PO TABS: 10.0000 mg | ORAL_TABLET | Freq: Every day | ORAL | 3 refills | Status: DC
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
## 2021-03-28 ENCOUNTER — Telehealth: Payer: Self-pay | Admitting: *Deleted

## 2021-03-28 ENCOUNTER — Encounter: Payer: Self-pay | Admitting: *Deleted

## 2021-03-28 DIAGNOSIS — Z87891 Personal history of nicotine dependence: Secondary | ICD-10-CM

## 2021-03-28 DIAGNOSIS — Z122 Encounter for screening for malignant neoplasm of respiratory organs: Secondary | ICD-10-CM

## 2021-03-28 DIAGNOSIS — F17200 Nicotine dependence, unspecified, uncomplicated: Secondary | ICD-10-CM

## 2021-03-28 NOTE — Telephone Encounter (Signed)
Received referral for low dose lung cancer screening CT scan. Message left at phone number listed in EMR for patient to call me back to facilitate scheduling scan.  

## 2021-03-28 NOTE — Telephone Encounter (Signed)
Received referral for initial lung cancer screening scan. Contacted patient and obtained smoking history,(current every day smoker, 1 ppd x 33 yrs) as well as answering questions related to screening process. Patient denies signs of lung cancer such as weight loss or hemoptysis. Patient denies comorbidity that would prevent curative treatment if lung cancer were found. Patient is scheduled for shared decision making visit and CT scan on 04/22/21 @ 1:30 pm.

## 2021-03-28 NOTE — Telephone Encounter (Signed)
Sent MyChart confirmation re: Lung Screening.

## 2021-04-11 ENCOUNTER — Telehealth (INDEPENDENT_AMBULATORY_CARE_PROVIDER_SITE_OTHER): Payer: Self-pay | Admitting: Gastroenterology

## 2021-04-11 ENCOUNTER — Encounter: Payer: Self-pay | Admitting: *Deleted

## 2021-04-11 ENCOUNTER — Other Ambulatory Visit: Payer: Self-pay

## 2021-04-11 DIAGNOSIS — Z1211 Encounter for screening for malignant neoplasm of colon: Secondary | ICD-10-CM

## 2021-04-11 MED ORDER — PEG 3350-KCL-NA BICARB-NACL 420 G PO SOLR: 4000.0000 mL | Freq: Once | ORAL | 0 refills | Status: AC

## 2021-04-11 NOTE — Progress Notes (Signed)
Gastroenterology Pre-Procedure Review  Request Date: 04/29/21 Requesting Physician: Dr. Marius Ditch  PATIENT REVIEW QUESTIONS: The patient responded to the following health history questions as indicated:    1. Are you having any GI issues? no 2. Do you have a personal history of Polyps? no 3. Do you have a family history of Colon Cancer or Polyps? no 4. Diabetes Mellitus? no 5. Joint replacements in the past 12 months?no 6. Major health problems in the past 3 months?no 7. Any artificial heart valves, MVP, or defibrillator?no    MEDICATIONS & ALLERGIES:    Patient reports the following regarding taking any anticoagulation/antiplatelet therapy:   Plavix, Coumadin, Eliquis, Xarelto, Lovenox, Pradaxa, Brilinta, or Effient? no Aspirin? no  Patient confirms/reports the following medications:  Current Outpatient Medications  Medication Sig Dispense Refill  . amLODipine (NORVASC) 10 MG tablet Take 1 tablet (10 mg total) by mouth daily. 90 tablet 3  . blood glucose meter kit and supplies KIT Dispense based on patient and insurance preference. Use up to four times daily as directed. (FOR ICD-10 E 16.2) 1 each 0  . cromolyn (OPTICROM) 4 % ophthalmic solution Place 1 drop into both eyes 4 (four) times daily. 10 mL 2  . famotidine (PEPCID) 20 MG tablet Take 1 tablet (20 mg total) by mouth 2 (two) times daily as needed for heartburn or indigestion. 180 tablet 1  . ketoconazole (NIZORAL) 2 % shampoo Apply 1 application topically 2 (two) times a week. 120 mL 2  . lisinopril (ZESTRIL) 20 MG tablet Take 1 tablet (20 mg total) by mouth daily. 90 tablet 3  . pentoxifylline (TRENTAL) 400 MG CR tablet Take 1 tablet (400 mg total) by mouth 2 (two) times daily with a meal. 60 tablet 2  . tadalafil (CIALIS) 20 MG tablet Take 1 tablet (20 mg total) by mouth every other day as needed for erectile dysfunction. 10 tablet 3  . loratadine (CLARITIN) 10 MG tablet Take 1 tablet (10 mg total) by mouth daily. 90 tablet 3    No current facility-administered medications for this visit.    Patient confirms/reports the following allergies:  No Known Allergies  No orders of the defined types were placed in this encounter.   AUTHORIZATION INFORMATION Primary Insurance: 1D#: Group #:  Secondary Insurance: 1D#: Group #:  SCHEDULE INFORMATION: Date: 04/29/21 Time: Location:[facility]

## 2021-04-22 ENCOUNTER — Inpatient Hospital Stay: Payer: BC Managed Care – PPO | Attending: Oncology | Admitting: Hospice and Palliative Medicine

## 2021-04-22 ENCOUNTER — Ambulatory Visit
Admission: RE | Admit: 2021-04-22 | Discharge: 2021-04-22 | Disposition: A | Discharge status: Home / Self Care | Payer: BC Managed Care – PPO | Source: Ambulatory Visit | Attending: Oncology | Admitting: Oncology

## 2021-04-22 ENCOUNTER — Encounter: Payer: Self-pay | Admitting: Oncology

## 2021-04-22 ENCOUNTER — Other Ambulatory Visit: Payer: Self-pay

## 2021-04-22 DIAGNOSIS — F17210 Nicotine dependence, cigarettes, uncomplicated: Secondary | ICD-10-CM

## 2021-04-22 DIAGNOSIS — Z122 Encounter for screening for malignant neoplasm of respiratory organs: Secondary | ICD-10-CM | POA: Diagnosis not present

## 2021-04-22 DIAGNOSIS — Z87891 Personal history of nicotine dependence: Secondary | ICD-10-CM | POA: Diagnosis not present

## 2021-04-22 DIAGNOSIS — F17200 Nicotine dependence, unspecified, uncomplicated: Secondary | ICD-10-CM | POA: Diagnosis not present

## 2021-04-22 NOTE — Progress Notes (Signed)
Virtual Visit via Video Note  I connected with@ on 04/22/21 at  1:30 PM EDT by a video enabled telemedicine application and verified that I am speaking with the correct person using two identifiers.  Location: Patient: [facility] Provider: home   I discussed the limitations of evaluation and management by telemedicine and the availability of in person appointments. The patient expressed understanding and agreed to proceed.  I discussed the assessment and treatment plan with the patient. The patient was provided an opportunity to ask questions and all were answered. The patient agreed with the plan and demonstrated an understanding of the instructions.   The patient was advised to call back or seek an in-person evaluation if the symptoms worsen or if the condition fails to improve as anticipated.   In accordance with CMS guidelines, patient has met eligibility criteria including age, absence of signs or symptoms of lung cancer.  Social History   Tobacco Use  . Smoking status: Current Every Day Smoker    Packs/day: 1.00    Years: 33.00    Pack years: 33.00    Types: Cigarettes  . Smokeless tobacco: Never Used  . Tobacco comment: Smoked 2ppd for 26 years, started smoking 50 y/o, 56 pack year hx  Vaping Use  . Vaping Use: Never used  Substance Use Topics  . Alcohol use: Yes    Alcohol/week: 21.0 standard drinks    Types: 21 Cans of beer per week    Comment: per day  . Drug use: No      A shared decision-making session was conducted prior to the performance of CT scan. This includes one or more decision aids, includes benefits and harms of screening, follow-up diagnostic testing, over-diagnosis, false positive rate, and total radiation exposure.   Counseling on the importance of adherence to annual lung cancer LDCT screening, impact of co-morbidities, and ability or willingness to undergo diagnosis and treatment is imperative for compliance of the program.   Counseling on the importance  of continued smoking cessation for former smokers; the importance of smoking cessation for current smokers, and information about tobacco cessation interventions have been given to patient including [place] and [employer] quit [place] programs.   Written order for lung cancer screening with LDCT has been given to the patient and any and all questions have been answered to the best of my abilities.    Yearly follow up will be coordinated by Burgess Estelle, Thoracic Navigator.  I provided 15 minutes of face-to-face video visit time during this encounter, and > 50% was spent counseling as documented under my assessment & plan.   Jacquelin Hawking, NP

## 2021-04-26 ENCOUNTER — Encounter: Payer: Self-pay | Admitting: Gastroenterology

## 2021-04-29 ENCOUNTER — Ambulatory Visit
Admission: RE | Admit: 2021-04-29 | Discharge: 2021-04-29 | Disposition: A | Discharge status: Home / Self Care | Payer: BC Managed Care – PPO | Attending: Gastroenterology | Admitting: Gastroenterology

## 2021-04-29 ENCOUNTER — Encounter: Payer: Self-pay | Admitting: Gastroenterology

## 2021-04-29 ENCOUNTER — Ambulatory Visit: Payer: BC Managed Care – PPO | Admitting: Certified Registered"

## 2021-04-29 ENCOUNTER — Encounter
Admission: RE | Disposition: A | Discharge status: Home / Self Care | Payer: Self-pay | Source: Home / Self Care | Attending: Gastroenterology

## 2021-04-29 ENCOUNTER — Encounter: Payer: Self-pay | Admitting: *Deleted

## 2021-04-29 DIAGNOSIS — E785 Hyperlipidemia, unspecified: Secondary | ICD-10-CM | POA: Diagnosis not present

## 2021-04-29 DIAGNOSIS — D124 Benign neoplasm of descending colon: Secondary | ICD-10-CM | POA: Insufficient documentation

## 2021-04-29 DIAGNOSIS — R7303 Prediabetes: Secondary | ICD-10-CM | POA: Insufficient documentation

## 2021-04-29 DIAGNOSIS — Z1211 Encounter for screening for malignant neoplasm of colon: Secondary | ICD-10-CM

## 2021-04-29 DIAGNOSIS — Z79899 Other long term (current) drug therapy: Secondary | ICD-10-CM | POA: Insufficient documentation

## 2021-04-29 DIAGNOSIS — Z8249 Family history of ischemic heart disease and other diseases of the circulatory system: Secondary | ICD-10-CM | POA: Insufficient documentation

## 2021-04-29 DIAGNOSIS — D120 Benign neoplasm of cecum: Secondary | ICD-10-CM | POA: Insufficient documentation

## 2021-04-29 DIAGNOSIS — K219 Gastro-esophageal reflux disease without esophagitis: Secondary | ICD-10-CM | POA: Insufficient documentation

## 2021-04-29 DIAGNOSIS — K644 Residual hemorrhoidal skin tags: Secondary | ICD-10-CM | POA: Diagnosis not present

## 2021-04-29 DIAGNOSIS — K635 Polyp of colon: Secondary | ICD-10-CM | POA: Diagnosis not present

## 2021-04-29 DIAGNOSIS — F17210 Nicotine dependence, cigarettes, uncomplicated: Secondary | ICD-10-CM | POA: Diagnosis not present

## 2021-04-29 HISTORY — DX: Prediabetes: R73.03

## 2021-04-29 HISTORY — DX: Sebaceous cyst: L72.3

## 2021-04-29 HISTORY — DX: Alcohol abuse, uncomplicated: F10.10

## 2021-04-29 HISTORY — DX: Gastro-esophageal reflux disease without esophagitis: K21.9

## 2021-04-29 HISTORY — DX: Acute atopic conjunctivitis, unspecified eye: H10.10

## 2021-04-29 HISTORY — PX: COLONOSCOPY WITH PROPOFOL: SHX5780

## 2021-04-29 HISTORY — DX: Male erectile dysfunction, unspecified: N52.9

## 2021-04-29 HISTORY — DX: Hyperlipidemia, unspecified: E78.5

## 2021-04-29 SURGERY — COLONOSCOPY WITH PROPOFOL
Anesthesia: General

## 2021-04-29 MED ORDER — PROPOFOL 500 MG/50ML IV EMUL
INTRAVENOUS | Status: AC
  Filled 2021-04-29: qty 150

## 2021-04-29 MED ORDER — LIDOCAINE HCL (PF) 2 % IJ SOLN
INTRAMUSCULAR | Status: AC
  Filled 2021-04-29: qty 6

## 2021-04-29 MED ORDER — MIDAZOLAM HCL 2 MG/2ML IJ SOLN
INTRAMUSCULAR | Status: AC
  Filled 2021-04-29: qty 2

## 2021-04-29 MED ORDER — MIDAZOLAM HCL 5 MG/5ML IJ SOLN
INTRAMUSCULAR | Status: DC | PRN
  Administered 2021-04-29: 2 mg via INTRAVENOUS

## 2021-04-29 MED ORDER — PROPOFOL 10 MG/ML IV BOLUS
INTRAVENOUS | Status: DC | PRN
  Administered 2021-04-29: 70 mg via INTRAVENOUS
  Administered 2021-04-29: 30 mg via INTRAVENOUS

## 2021-04-29 MED ORDER — SODIUM CHLORIDE 0.9 % IV SOLN
INTRAVENOUS | Status: DC
  Administered 2021-04-29: 20 mL/h via INTRAVENOUS

## 2021-04-29 MED ORDER — PROPOFOL 500 MG/50ML IV EMUL
INTRAVENOUS | Status: DC | PRN
  Administered 2021-04-29: 120 ug/kg/min via INTRAVENOUS

## 2021-04-29 MED ORDER — LIDOCAINE 2% (20 MG/ML) 5 ML SYRINGE
INTRAMUSCULAR | Status: DC | PRN
  Administered 2021-04-29: 25 mg via INTRAVENOUS

## 2021-04-29 NOTE — Transfer of Care (Signed)
Immediate Anesthesia Transfer of Care Note  Patient: Calvin Taylor  Procedure(s) Performed: COLONOSCOPY WITH PROPOFOL (N/A )  Patient Location: Endoscopy Unit  Anesthesia Type:General  Level of Consciousness: sedated  Airway & Oxygen Therapy: Patient Spontanous Breathing  Post-op Assessment: Report given to RN and Post -op Vital signs reviewed and stable  Post vital signs: Reviewed  Last Vitals:  Vitals Value Taken Time  BP    Temp    Pulse 70 04/29/21 0904  Resp 16 04/29/21 0904  SpO2 100 % 04/29/21 0904  Vitals shown include unvalidated device data.  Last Pain:  Vitals:   04/29/21 0732  TempSrc: Temporal  PainSc: 2          Complications: No complications documented.

## 2021-04-29 NOTE — Anesthesia Postprocedure Evaluation (Signed)
Anesthesia Post Note  Patient: Calvin Taylor  Procedure(s) Performed: COLONOSCOPY WITH PROPOFOL (N/A )  Patient location during evaluation: Endoscopy Anesthesia Type: General Level of consciousness: awake and alert Pain management: pain level controlled Vital Signs Assessment: post-procedure vital signs reviewed and stable Respiratory status: spontaneous breathing, nonlabored ventilation, respiratory function stable and patient connected to nasal cannula oxygen Cardiovascular status: blood pressure returned to baseline and stable Postop Assessment: no apparent nausea or vomiting Anesthetic complications: no   No complications documented.   Last Vitals:  Vitals:   04/29/21 0924 04/29/21 0934  BP: (!) 158/88 (!) 163/94  Pulse:    Resp:    Temp:    SpO2:      Last Pain:  Vitals:   04/29/21 0934  TempSrc:   PainSc: 0-No pain                 Martha Clan

## 2021-04-29 NOTE — Op Note (Signed)
[facility] Gastroenterology Patient Name: Calvin Taylor Procedure Date: 04/29/2021 6:52 AM MRN: [id number] Account #: [id number] Date of Birth: [birth year] Admit Type: Outpatient Age: 50 Room: [facility] ENDO ROOM 2 Gender: Male Note Status: Finalized Procedure:             Colonoscopy Indications:           Screening for colorectal malignant neoplasm, This is                         the patient's first colonoscopy Providers:             Lin Landsman MD, MD Referring MD:          Delsa Grana (Referring MD) Medicines:             General Anesthesia Complications:         No immediate complications. Estimated blood loss: None. Procedure:             Pre-Anesthesia Assessment:                        - Prior to the procedure, a History and Physical was                         performed, and patient medications and allergies were                         reviewed. The patient is competent. The risks and                         benefits of the procedure and the sedation options and                         risks were discussed with the patient. All questions                         were answered and informed consent was obtained.                         Patient identification and proposed procedure were                         verified by the physician, the nurse, the                         anesthesiologist, the anesthetist and the technician                         in the pre-procedure area in the procedure room in the                         endoscopy suite. Mental Status Examination: alert and                         oriented. Airway Examination: normal oropharyngeal                         airway and neck mobility. Respiratory Examination:  clear to auscultation. CV Examination: normal.                         Prophylactic Antibiotics: The patient does not require                         prophylactic antibiotics. Prior Anticoagulants: The                          patient has taken no previous anticoagulant or                         antiplatelet agents. ASA Grade Assessment: II - A                         patient with mild systemic disease. After reviewing                         the risks and benefits, the patient was deemed in                         satisfactory condition to undergo the procedure. The                         anesthesia plan was to use general anesthesia.                         Immediately prior to administration of medications,                         the patient was re-assessed for adequacy to receive                         sedatives. The heart rate, respiratory rate, oxygen                         saturations, blood pressure, adequacy of pulmonary                         ventilation, and response to care were monitored                         throughout the procedure. The physical status of the                         patient was re-assessed after the procedure.                        After obtaining informed consent, the colonoscope was                         passed under direct vision. Throughout the procedure,                         the patient's blood pressure, pulse, and oxygen                         saturations were monitored continuously. The  Colonoscope was introduced through the anus and                         advanced to the the cecum, identified by appendiceal                         orifice and ileocecal valve. The colonoscopy was                         extremely difficult due to significant looping and a                         tortuous colon. Successful completion of the procedure                         was aided by changing the patient to a supine                         position, changing the patient to a prone position and                         applying abdominal pressure. The patient tolerated the                         procedure well. The quality of the  bowel preparation                         was evaluated using the BBPS [facility] Bowel Preparation                         Scale) with scores of: Right Colon = 3, Transverse                         Colon = 3 and Left Colon = 3 (entire mucosa seen well                         with no residual staining, small fragments of stool or                         opaque liquid). The total BBPS score equals 9. Findings:      Skin tags were found on perianal exam.      Six sessile polyps were found in the descending colon and cecum. The       polyps were 5 to 6 mm in size. These polyps were removed with a cold       snare. Resection and retrieval were complete. Estimated blood loss: none.      The retroflexed view of the distal rectum and anal verge was normal and       showed no anal or rectal abnormalities. Impression:            - Perianal skin tags found on perianal exam.                        - Six 5 to 6 mm polyps in the descending colon and in  the cecum, removed with a cold snare. Resected and                         retrieved.                        - The distal rectum and anal verge are normal on                         retroflexion view. Recommendation:        - Discharge patient to home (with escort).                        - Resume previous diet today.                        - Continue present medications.                        - Await pathology results.                        - Repeat colonoscopy in 3 - 5 years for surveillance                         of multiple polyps. Procedure Code(s):     --- Professional ---                        [phone], Colonoscopy, flexible; with removal of                         tumor(s), polyp(s), or other lesion(s) by snare                         technique Diagnosis Code(s):     --- Professional ---                        Z12.11, Encounter for screening for malignant neoplasm                         of colon                         K63.5, Polyp of colon                        K64.4, Residual hemorrhoidal skin tags CPT copyright 2019 American Medical Association. All rights reserved. The codes documented in this report are preliminary and upon coder review may  be revised to meet current compliance requirements. Dr. Ulyess Mort Lin Landsman MD, MD 04/29/2021 9:02:39 AM This report has been signed electronically. Number of Addenda: 0 Note Initiated On: 04/29/2021 6:52 AM Scope Withdrawal Time: 0 hours 25 minutes 26 seconds  Total Procedure Duration: 0 hours 42 minutes 23 seconds  Estimated Blood Loss:  Estimated blood loss: none.      [facility]

## 2021-04-29 NOTE — Anesthesia Preprocedure Evaluation (Signed)
Anesthesia Evaluation  Patient identified by MRN, date of birth, ID band Patient awake    Reviewed: Allergy & Precautions, H&P , NPO status , Patient's Chart, lab work & pertinent test results, reviewed documented beta blocker date and time   History of Anesthesia Complications Negative for: history of anesthetic complications  Airway Mallampati: II  TM Distance: >3 FB Neck ROM: full    Dental  (+) Dental Advidsory Given, Poor Dentition, Missing, Chipped   Pulmonary neg shortness of breath, neg COPD, neg recent URI, Current Smoker and Patient abstained from smoking.,    Pulmonary exam normal breath sounds clear to auscultation       Cardiovascular Exercise Tolerance: Good hypertension, (-) angina(-) Past MI and (-) Cardiac Stents Normal cardiovascular exam(-) dysrhythmias (-) Valvular Problems/Murmurs Rhythm:regular Rate:Normal     Neuro/Psych negative neurological ROS  negative psych ROS   GI/Hepatic GERD  ,(+)     substance abuse  alcohol use,   Endo/Other  negative endocrine ROS  Renal/GU negative Renal ROS  negative genitourinary   Musculoskeletal   Abdominal   Peds  Hematology negative hematology ROS (+)   Anesthesia Other Findings Past Medical History: No date: Alcohol abuse No date: Allergic conjunctivitis No date: ED (erectile dysfunction) No date: GERD (gastroesophageal reflux disease) No date: Hyperlipidemia No date: Hypertension No date: Pre-diabetes No date: Sebaceous cyst   Reproductive/Obstetrics negative OB ROS                             Anesthesia Physical Anesthesia Plan  ASA: II  Anesthesia Plan: General   Post-op Pain Management:    Induction: Intravenous  PONV Risk Score and Plan: 1 and TIVA and Propofol infusion  Airway Management Planned: Natural Airway and Nasal Cannula  Additional Equipment:   Intra-op Plan:   Post-operative Plan:    Informed Consent: I have reviewed the patients History and Physical, chart, labs and discussed the procedure including the risks, benefits and alternatives for the proposed anesthesia with the patient or authorized representative who has indicated his/her understanding and acceptance.     Dental Advisory Given  Plan Discussed with: Anesthesiologist, CRNA and Surgeon  Anesthesia Plan Comments:         Anesthesia Quick Evaluation

## 2021-04-29 NOTE — H&P (Signed)
Calvin Darby, MD [address]  [place]  [place], [place] [address]  Main: [phone]  Fax: [phone] Pager: [phone]  Primary Care Physician:  Delsa Grana, PA-C Primary Gastroenterologist:  Dr. Cephas Taylor  Pre-Procedure History & Physical: HPI:  Calvin Taylor is a 50 y.o. male is here for an colonoscopy.   Past Medical History:  Diagnosis Date  . Alcohol abuse   . Allergic conjunctivitis   . ED (erectile dysfunction)   . GERD (gastroesophageal reflux disease)   . Hyperlipidemia   . Hypertension   . Pre-diabetes   . Sebaceous cyst     Past Surgical History:  Procedure Laterality Date  . right eye surgery      Prior to Admission medications   Medication Sig Start Date End Date Taking? Authorizing Provider  amLODipine (NORVASC) 10 MG tablet Take 1 tablet (10 mg total) by mouth daily. 03/25/21  Yes Delsa Grana, PA-C  blood glucose meter kit and supplies KIT Dispense based on patient and insurance preference. Use up to four times daily as directed. (FOR ICD-10 E 16.2) 03/14/20  Yes Delsa Grana, PA-C  cromolyn (OPTICROM) 4 % ophthalmic solution Place 1 drop into both eyes 4 (four) times daily. 03/25/21  Yes Delsa Grana, PA-C  famotidine (PEPCID) 20 MG tablet Take 1 tablet (20 mg total) by mouth 2 (two) times daily as needed for heartburn or indigestion. 03/25/21  Yes Delsa Grana, PA-C  ketoconazole (NIZORAL) 2 % shampoo Apply 1 application topically 2 (two) times a week. 03/25/21  Yes Delsa Grana, PA-C  lisinopril (ZESTRIL) 20 MG tablet Take 1 tablet (20 mg total) by mouth daily. 03/25/21  Yes Delsa Grana, PA-C  pentoxifylline (TRENTAL) 400 MG CR tablet Take 1 tablet (400 mg total) by mouth 2 (two) times daily with a meal. 09/06/20  Yes Stoioff, Ronda Fairly, MD  tadalafil (CIALIS) 20 MG tablet Take 1 tablet (20 mg total) by mouth every other day as needed for erectile dysfunction. 03/25/21  Yes Delsa Grana, PA-C  loratadine (CLARITIN) 10 MG tablet Take 1 tablet  (10 mg total) by mouth daily. 03/14/19 03/13/20  Hubbard Hartshorn, FNP    Allergies as of 04/12/2021  . (No Known Allergies)    Family History  Problem Relation Age of Onset  . Hypertension Mother   . Hypertension Father     Social History   Socioeconomic History  . Marital status: Single    Spouse name: Not on file  . Number of children: 1  . Years of education: Not on file  . Highest education level: Not on file  Occupational History  . Not on file  Tobacco Use  . Smoking status: Current Every Day Smoker    Packs/day: 1.00    Years: 33.00    Pack years: 33.00    Types: Cigarettes  . Smokeless tobacco: Never Used  . Tobacco comment: Smoked 2ppd for 26 years, started smoking 50 y/o, 56 pack year hx  Vaping Use  . Vaping Use: Never used  Substance and Sexual Activity  . Alcohol use: Yes    Alcohol/week: 21.0 standard drinks    Types: 21 Cans of beer per week    Comment: per day  . Drug use: No  . Sexual activity: Yes    Partners: Female  Other Topics Concern  . Not on file  Social History Narrative   Works for a [employer] Strain: Not on file  Food Insecurity: Not on file  Transportation Needs: Not on file  Physical Activity: Not on file  Stress: Not on file  Social Connections: Not on file  Intimate Partner Violence: Not on file    Review of Systems: See HPI, otherwise negative ROS  Physical Exam: BP (!) 176/111   Pulse 66   Temp (!) 96.3 F (35.7 C) (Temporal)   Resp 20   Ht 5' 8"  (1.727 m)   Wt 73.5 kg   SpO2 100%   BMI 24.63 kg/m  General:   Alert,  pleasant and cooperative in NAD Head:  Normocephalic and atraumatic. Neck:  Supple; no masses or thyromegaly. Lungs:  Clear throughout to auscultation.    Heart:  Regular rate and rhythm. Abdomen:  Soft, nontender and nondistended. Normal bowel sounds, without guarding, and without rebound.   Neurologic:  Alert and  oriented x4;  grossly  normal neurologically.  Impression/Plan: Calvin Taylor is here for an colonoscopy to be performed for colon cancer screening  Risks, benefits, limitations, and alternatives regarding  colonoscopy have been reviewed with the patient.  Questions have been answered.  All parties agreeable.   Sherri Sear, MD  04/29/2021, 8:12 AM

## 2021-04-30 LAB — SURGICAL PATHOLOGY

## 2021-05-01 ENCOUNTER — Encounter: Payer: Self-pay | Admitting: Gastroenterology

## 2021-06-07 DIAGNOSIS — H53021 Refractive amblyopia, right eye: Secondary | ICD-10-CM | POA: Diagnosis not present

## 2021-06-07 DIAGNOSIS — I10 Essential (primary) hypertension: Secondary | ICD-10-CM | POA: Diagnosis not present

## 2021-06-13 ENCOUNTER — Encounter: Payer: Self-pay | Admitting: Family Medicine

## 2021-07-10 DIAGNOSIS — H16142 Punctate keratitis, left eye: Secondary | ICD-10-CM | POA: Diagnosis not present

## 2021-07-10 DIAGNOSIS — H1045 Other chronic allergic conjunctivitis: Secondary | ICD-10-CM | POA: Diagnosis not present

## 2021-08-14 ENCOUNTER — Other Ambulatory Visit: Payer: Self-pay | Admitting: Family Medicine

## 2021-08-14 ENCOUNTER — Encounter: Payer: Self-pay | Admitting: Family Medicine

## 2021-08-14 ENCOUNTER — Other Ambulatory Visit: Payer: Self-pay

## 2021-08-14 ENCOUNTER — Ambulatory Visit (INDEPENDENT_AMBULATORY_CARE_PROVIDER_SITE_OTHER): Payer: BC Managed Care – PPO | Admitting: Family Medicine

## 2021-08-14 VITALS — BP 126/82 | HR 93 | Temp 98.1°F | Resp 16 | Ht 69.0 in | Wt 161.0 lb

## 2021-08-14 DIAGNOSIS — M25512 Pain in left shoulder: Secondary | ICD-10-CM

## 2021-08-14 DIAGNOSIS — M7542 Impingement syndrome of left shoulder: Secondary | ICD-10-CM

## 2021-08-14 MED ORDER — MELOXICAM 15 MG PO TABS: 15.0000 mg | ORAL_TABLET | Freq: Every day | ORAL | 0 refills | Status: DC
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
## 2021-08-15 ENCOUNTER — Telehealth: Payer: Self-pay

## 2021-08-15 NOTE — Telephone Encounter (Signed)
Copied from [place] [id number]. Topic: General - Other >> Aug 15, 2021 12:30 PM Tessa Lerner A wrote: Reason for CRM: Patient would like to be contacted regarding a letter for modified duties/restrictions at work   The patient previously declined the letter but has now been notified by their job that they need one   Please contact further when possible

## 2021-08-15 NOTE — Telephone Encounter (Signed)
Copied from [place] [id number]. Topic: General - Other >> Aug 15, 2021 11:09 AM Pawlus, Brayton Layman A wrote: Reason for CRM: Pt recently had an appt with Dr Ancil Boozer and stated he actually will require a doctors note for work stating what he can and cannot do. Please advise.

## 2021-08-16 ENCOUNTER — Telehealth: Payer: Self-pay

## 2021-08-16 NOTE — Telephone Encounter (Signed)
Patient will pick up work note on 08/20/2021 at office visit.

## 2021-08-16 NOTE — Telephone Encounter (Signed)
Note completed. Patient aware.

## 2021-08-20 ENCOUNTER — Ambulatory Visit: Payer: BC Managed Care – PPO | Admitting: Family Medicine

## 2021-08-20 DIAGNOSIS — I10 Essential (primary) hypertension: Secondary | ICD-10-CM

## 2021-08-20 DIAGNOSIS — E785 Hyperlipidemia, unspecified: Secondary | ICD-10-CM

## 2021-08-20 DIAGNOSIS — R7303 Prediabetes: Secondary | ICD-10-CM

## 2021-08-20 DIAGNOSIS — K219 Gastro-esophageal reflux disease without esophagitis: Secondary | ICD-10-CM

## 2021-08-20 DIAGNOSIS — N529 Male erectile dysfunction, unspecified: Secondary | ICD-10-CM

## 2021-09-02 ENCOUNTER — Ambulatory Visit (INDEPENDENT_AMBULATORY_CARE_PROVIDER_SITE_OTHER): Payer: BC Managed Care – PPO | Admitting: Family Medicine

## 2021-09-02 ENCOUNTER — Encounter: Payer: Self-pay | Admitting: Family Medicine

## 2021-09-02 ENCOUNTER — Other Ambulatory Visit: Payer: Self-pay

## 2021-09-02 VITALS — BP 136/82 | HR 86 | Temp 97.9°F | Resp 16 | Ht 69.0 in | Wt 160.0 lb

## 2021-09-02 DIAGNOSIS — Z5181 Encounter for therapeutic drug level monitoring: Secondary | ICD-10-CM | POA: Diagnosis not present

## 2021-09-02 DIAGNOSIS — E785 Hyperlipidemia, unspecified: Secondary | ICD-10-CM

## 2021-09-02 DIAGNOSIS — K219 Gastro-esophageal reflux disease without esophagitis: Secondary | ICD-10-CM

## 2021-09-02 DIAGNOSIS — I10 Essential (primary) hypertension: Secondary | ICD-10-CM

## 2021-09-02 DIAGNOSIS — Z716 Tobacco abuse counseling: Secondary | ICD-10-CM

## 2021-09-02 DIAGNOSIS — F17210 Nicotine dependence, cigarettes, uncomplicated: Secondary | ICD-10-CM

## 2021-09-02 DIAGNOSIS — R7303 Prediabetes: Secondary | ICD-10-CM

## 2021-09-02 DIAGNOSIS — F17200 Nicotine dependence, unspecified, uncomplicated: Secondary | ICD-10-CM
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
## 2021-09-02 NOTE — Patient Instructions (Signed)
Lab Results  Component Value Date   CHOL 227 (H) 08/27/2020   HDL 102 08/27/2020   LDLCALC 110 (H) 08/27/2020   TRIG 64 08/27/2020   CHOLHDL 2.2 08/27/2020   Steps to Quit Smoking Smoking tobacco is the leading cause of preventable death. It can affect almost every organ in the body. Smoking puts you and people around you at risk for many serious, long-lasting (chronic) diseases. Quitting smoking can be hard, but it is one of the best things that you can do for your health. It is never too late to quit. How do I get ready to quit? When you decide to quit smoking, make a plan to help you succeed. Before you quit: Pick a date to quit. Set a date within the next 2 weeks to give you time to prepare. Write down the reasons why you are quitting. Keep this list in places where you will see it often. Tell your family, friends, and co-workers that you are quitting. Their support is important. Talk with your doctor about the choices that may help you quit. Find out if your health insurance will pay for these treatments. Know the people, places, things, and activities that make you want to smoke (triggers). Avoid them. What first steps can I take to quit smoking? Throw away all cigarettes at home, at work, and in your car. Throw away the things that you use when you smoke, such as ashtrays and lighters. Clean your car. Make sure to empty the ashtray. Clean your home, including curtains and carpets. What can I do to help me quit smoking? Talk with your doctor about taking medicines and seeing a counselor at the same time. You are more likely to succeed when you do both. If you are pregnant or breastfeeding, talk with your doctor about counseling or other ways to quit smoking. Do not take medicine to help you quit smoking unless your doctor tells you to do so. To quit smoking: Quit right away Quit smoking totally, instead of slowly cutting back on how much you smoke over a period of time. Go to  counseling. You are more likely to quit if you go to counseling sessions regularly. Take medicine You may take medicines to help you quit. Some medicines need a prescription, and some you can buy over-the-counter. Some medicines may contain a drug called nicotine to replace the nicotine in cigarettes. Medicines may: Help you to stop having the desire to smoke (cravings). Help to stop the problems that come when you stop smoking (withdrawal symptoms). Your doctor may ask you to use: Nicotine patches, gum, or lozenges. Nicotine inhalers or sprays. Non-nicotine medicine that is taken by mouth. Find resources Find resources and other ways to help you quit smoking and remain smoke-free after you quit. These resources are most helpful when you use them often. They include: Online chats with a Social worker. Phone quitlines. Printed Furniture conservator/restorer. Support groups or group counseling. Text messaging programs. Mobile phone apps. Use apps on your mobile phone or tablet that can help you stick to your quit plan. There are many free apps for mobile phones and tablets as well as websites. Examples include Quit Guide from the [employer] and smokefree.gov  What things can I do to make it easier to quit?  Talk to your family and friends. Ask them to support and encourage you. Call a phone quitline (1-800-QUIT-NOW), reach out to support groups, or work with a Social worker. Ask people who smoke to not smoke around you. Avoid  places that make you want to smoke, such as: Bars. Parties. Smoke-break areas at work. Spend time with people who do not smoke. Lower the stress in your life. Stress can make you want to smoke. Try these things to help your stress: Getting regular exercise. Doing deep-breathing exercises. Doing yoga. Meditating. Doing a body scan. To do this, close your eyes, focus on one area of your body at a time from head to toe. Notice which parts of your body are tense. Try to relax the muscles in  those areas. How will I feel when I quit smoking? Day 1 to 3 weeks Within the first 24 hours, you may start to have some problems that come from quitting tobacco. These problems are very bad 2-3 days after you quit, but they do not often last for more than 2-3 weeks. You may get these symptoms: Mood swings. Feeling restless, nervous, angry, or annoyed. Trouble concentrating. Dizziness. Strong desire for high-sugar foods and nicotine. Weight gain. Trouble pooping (constipation). Feeling like you may vomit (nausea). Coughing or a sore throat. Changes in how the medicines that you take for other issues work in your body. Depression. Trouble sleeping (insomnia). Week 3 and afterward After the first 2-3 weeks of quitting, you may start to notice more positive results, such as: Better sense of smell and taste. Less coughing and sore throat. Slower heart rate. Lower blood pressure. Clearer skin. Better breathing. Fewer sick days. Quitting smoking can be hard. Do not give up if you fail the first time. Some people need to try a few times before they succeed. Do your best to stick to your quit plan, and talk with your doctor if you have any questions or concerns. Summary Smoking tobacco is the leading cause of preventable death. Quitting smoking can be hard, but it is one of the best things that you can do for your health. When you decide to quit smoking, make a plan to help you succeed. Quit smoking right away, not slowly over a period of time. When you start quitting, seek help from your doctor, family, or friends. This information is not intended to replace advice given to you by your health care provider. Make sure you discuss any questions you have with your health care provider. Document Revised: 08/26/2019 Document Reviewed: 02/19/2019 Elsevier Patient Education  [employer].  Chronic Obstructive Pulmonary Disease Chronic obstructive pulmonary disease (COPD) is a long-term  (chronic) condition that affects the lungs. COPD is a general term that can be used to describe many different lung problems that cause lung inflammation and limit airflow, including chronic bronchitis and emphysema. If you have COPD, your lung function will probably never return to normal. In most cases, it gets worse over time. However, there are steps you can take to slow the progression of the disease and improve your quality of life. What are the causes? This condition may be caused by: Smoking. This is the most common cause. Certain genes passed down through families. What increases the risk? The following factors may make you more likely to develop this condition: Being exposed to secondhand smoke from cigarettes, pipes, or cigars. Being exposed to chemicals and other irritants, such as fumes and dust in the work environment. Having chronic lung conditions or infections. What are the signs or symptoms? Symptoms of this condition include: Shortness of breath, especially during physical activity. Chronic cough with a large amount of thick mucus. Sometimes, the cough may not have any mucus (dry cough). Wheezing and  rapid breathing. Gray or bluish discoloration (cyanosis) of the skin, especially in the fingers, toes, or lips. Feeling tired (fatigue). Weight loss. Chest tightness. Frequent infections. Episodes when breathing symptoms become much worse (exacerbations). At the later stages of this disease, you may have swelling in the ankles, feet, or legs. How is this diagnosed? This condition is diagnosed based on: Your medical history. A physical exam. You may also have tests, including: Lung (pulmonary) function tests. This may include a spirometry test, which measures your ability to exhale properly. Chest X-ray. CT scan. Blood tests. How is this treated? This condition may be treated with: Medicines. These may include inhaled rescue medicines to treat acute exacerbations as  well as medicines that you take long-term (maintenance medicines) to prevent flare-ups of COPD. Bronchodilators help treat COPD by dilating the airways to allow increased airflow and make your breathing more comfortable. Steroids can reduce airway inflammation and help prevent exacerbations. Smoking cessation. If you smoke, your health care provider may ask you to quit, and may also recommend therapy or replacement products to help you quit. Pulmonary rehabilitation. This may involve working with a team of health care providers and specialists, such as respiratory, occupational, and physical therapists. Exercise and physical activity. These are beneficial for nearly all people with COPD. Nutrition therapy to gain weight, if you are underweight. Oxygen. Supplemental oxygen therapy is only helpful if you have a low oxygen level in your blood (hypoxemia). Lung surgery or transplant. Palliative care. This is to help people with COPD feel comfortable when treatment is no longer working. Follow these instructions at home: Medicines Take over-the-counter and prescription medicines only as told by your health care provider. This includes inhaled medicines and pills. Talk to your health care provider before taking any cough or allergy medicines. You may need to avoid certain medicines that dry out your airways. Lifestyle If you smoke, the most important thing that you can do is to stop smoking. Continuing to smoke will cause the disease to progress faster. Do not use any products that contain nicotine or tobacco. These products include cigarettes, chewing tobacco, and vaping devices, such as e-cigarettes. If you need help quitting, ask your health care provider. Avoid exposure to things that irritate your lungs, such as smoke, chemicals, and fumes. Stay active, but balance activity with periods of rest. Exercise and physical activity will help you maintain your ability to do things you want to do. Learn  and use relaxation techniques to manage stress and to control your breathing. Get the right amount of sleep and get quality sleep. Most adults need 7 or more hours per night. Eat healthy foods. Eating smaller, more frequent meals and resting before meals may help you maintain your strength. Controlled breathing Learn and use controlled breathing techniques as directed by your health care provider. Controlled breathing techniques include: Pursed lip breathing. Start by breathing in (inhaling) through your nose for 1 second. Then, purse your lips as if you were going to whistle and breathe out (exhale) through the pursed lips for 2 seconds. Diaphragmatic breathing. Start by putting one hand on your abdomen just above your waist. Inhale slowly through your nose. The hand on your abdomen should move out. Then purse your lips and exhale slowly. You should be able to feel the hand on your abdomen moving in as you exhale.  Controlled coughing Learn and use controlled coughing to clear mucus from your lungs. Controlled coughing is a series of short, progressive coughs. The steps of controlled  coughing are: Lean your head slightly forward. Breathe in deeply using diaphragmatic breathing. Try to hold your breath for 3 seconds. Keep your mouth slightly open while coughing twice. Spit any mucus out into a tissue. Rest and repeat the steps once or twice as needed. General instructions Make sure you receive all the vaccines that your health care provider recommends, especially the pneumococcal and influenza vaccines. Preventing infection and hospitalization is very important when you have COPD. Drink enough fluid to keep your urine pale yellow, unless you have a medical condition that requires fluid restriction. Use oxygen therapy and pulmonary rehabilitation if told by your health care provider. If you require home oxygen therapy, ask your health care provider whether you should purchase a pulse oximeter to  measure your oxygen level at home. Work with your health care provider to develop a COPD action plan. This will help you know what steps to take if your condition gets worse. Keep other chronic health conditions under control as told by your health care provider. Avoid extreme temperature and humidity changes. Avoid contact with people who have an illness that spreads from person to person (is contagious), such as viral infections or pneumonia. Keep all follow-up visits. This is important. Contact a health care provider if: You are coughing up more mucus than usual. There is a change in the color or thickness of your mucus. Your breathing is more labored than usual. Your breathing is faster than usual. You have difficulty sleeping. You need to use your rescue medicines or inhalers more often than expected. You have trouble doing routine activities such as getting dressed or walking around the house. Get help right away if: You have shortness of breath while you are resting. You have shortness of breath that prevents you from: Being able to talk. Performing your usual physical activities. You have chest pain lasting longer than 5 minutes. Your skin color is more blue (cyanotic) than usual. You measure low oxygen saturations for longer than 5 minutes with a pulse oximeter. You have a fever. You feel too tired to breathe normally. These symptoms may represent a serious problem that is an emergency. Do not wait to see if the symptoms will go away. Get medical help right away. Call your local emergency services (911 in the [place] Do not drive yourself to the hospital. Summary Chronic obstructive pulmonary disease (COPD) is a long-term (chronic) condition that affects the lungs. Your lung function will probably never return to normal. In most cases, it gets worse over time. However, there are steps you can take to slow the progression of the disease and improve your quality of life. Treatment  for COPD may include taking medicines, quitting smoking, pulmonary rehabilitation, and changes to diet and exercise. As the disease progresses, you may need oxygen therapy, a lung transplant, or palliative care. To help manage your condition, do not smoke, avoid exposure to things that irritate your lungs, stay up to date on all vaccines, and follow your health care provider's instructions for taking medicines. This information is not intended to replace advice given to you by your health care provider. Make sure you discuss any questions you have with your health care provider. Document Revised: 10/09/2020 Document Reviewed: 10/09/2020 Elsevier Patient Education  2022 [employer].

## 2021-09-03 LAB — LIPID PANEL
Cholesterol: 197 mg/dL (ref <200)
HDL: 89 mg/dL (ref >=40)
LDL Cholesterol (Calc): 86 mg/dL (calc)
Non-HDL Cholesterol (Calc): 108 mg/dL (calc) (ref <130)
Total CHOL/HDL Ratio: 2.2 (calc) (ref <5.0)
Triglycerides: 119 mg/dL (ref <150)

## 2021-09-03 LAB — COMPLETE METABOLIC PANEL WITH GFR
AG Ratio: 1.9 (calc) (ref 1.0–2.5)
ALT: 13 U/L (ref 9–46)
AST: 18 U/L (ref 10–35)
Albumin: 4.6 g/dL (ref 3.6–5.1)
Alkaline phosphatase (APISO): 83 U/L (ref 35–144)
BUN: 15 mg/dL (ref 7–25)
CO2: 31 mmol/L (ref 20–32)
Calcium: 9.8 mg/dL (ref 8.6–10.3)
Chloride: 101 mmol/L (ref 98–110)
Creat: 1.14 mg/dL (ref 0.70–1.30)
Globulin: 2.4 g/dL (calc) (ref 1.9–3.7)
Glucose, Bld: 59 mg/dL — ABNORMAL LOW (ref 65–99)
Potassium: 4.2 mmol/L (ref 3.5–5.3)
Sodium: 139 mmol/L (ref 135–146)
Total Bilirubin: 0.4 mg/dL (ref 0.2–1.2)
Total Protein: 7 g/dL (ref 6.1–8.1)
eGFR: 78 mL/min/{1.73_m2} (ref >=60)

## 2021-09-03 LAB — HEMOGLOBIN A1C
Hgb A1c MFr Bld: 5.6 % of total Hgb (ref <5.7)
Mean Plasma Glucose: 114 mg/dL
eAG (mmol/L): 6.3 mmol/L

## 2021-09-10 ENCOUNTER — Other Ambulatory Visit: Payer: Self-pay | Admitting: Family Medicine

## 2021-09-10 DIAGNOSIS — M25512 Pain in left shoulder: Secondary | ICD-10-CM

## 2021-09-10 DIAGNOSIS — M7542 Impingement syndrome of left shoulder: Secondary | ICD-10-CM

## 2021-10-06 ENCOUNTER — Other Ambulatory Visit: Payer: Self-pay | Admitting: Family Medicine

## 2021-10-09 MED ORDER — LORATADINE 10 MG PO TABS: 10.0000 mg | ORAL_TABLET | Freq: Every day | ORAL | 3 refills | Status: DC

## 2021-10-16 ENCOUNTER — Other Ambulatory Visit: Payer: Self-pay | Admitting: Orthopaedic Surgery

## 2021-10-16 DIAGNOSIS — M778 Other enthesopathies, not elsewhere classified: Secondary | ICD-10-CM

## 2021-11-05 ENCOUNTER — Other Ambulatory Visit: Payer: Self-pay

## 2021-11-05 ENCOUNTER — Ambulatory Visit
Admission: RE | Admit: 2021-11-05 | Discharge: 2021-11-05 | Disposition: A | Discharge status: Home / Self Care | Payer: Worker's Compensation | Source: Ambulatory Visit | Attending: Orthopaedic Surgery | Admitting: Orthopaedic Surgery

## 2021-11-05 DIAGNOSIS — M778 Other enthesopathies, not elsewhere classified: Secondary | ICD-10-CM

## 2022-03-03 ENCOUNTER — Ambulatory Visit: Payer: BC Managed Care – PPO | Admitting: Family Medicine

## 2022-04-08 ENCOUNTER — Other Ambulatory Visit: Payer: Self-pay | Admitting: Family Medicine

## 2022-04-08 DIAGNOSIS — N529 Male erectile dysfunction, unspecified: Secondary | ICD-10-CM

## 2022-04-15 DIAGNOSIS — I10 Essential (primary) hypertension: Secondary | ICD-10-CM | POA: Diagnosis not present

## 2022-05-14 ENCOUNTER — Ambulatory Visit (INDEPENDENT_AMBULATORY_CARE_PROVIDER_SITE_OTHER): Payer: BC Managed Care – PPO | Admitting: Family Medicine

## 2022-05-14 ENCOUNTER — Encounter: Payer: Self-pay | Admitting: Family Medicine

## 2022-05-14 VITALS — BP 106/62 | HR 90 | Temp 98.1°F | Resp 16 | Ht 69.0 in | Wt 166.0 lb

## 2022-05-14 DIAGNOSIS — E785 Hyperlipidemia, unspecified: Secondary | ICD-10-CM | POA: Diagnosis not present

## 2022-05-14 DIAGNOSIS — I10 Essential (primary) hypertension: Secondary | ICD-10-CM

## 2022-05-14 DIAGNOSIS — F1010 Alcohol abuse, uncomplicated: Secondary | ICD-10-CM | POA: Diagnosis not present

## 2022-05-14 DIAGNOSIS — F17200 Nicotine dependence, unspecified, uncomplicated: Secondary | ICD-10-CM

## 2022-05-14 DIAGNOSIS — L259 Unspecified contact dermatitis, unspecified cause: Secondary | ICD-10-CM

## 2022-05-14 MED ORDER — LISINOPRIL 20 MG PO TABS: 20.0000 mg | ORAL_TABLET | Freq: Every day | ORAL | 3 refills | Status: DC

## 2022-05-14 MED ORDER — PREDNISONE 10 MG PO TABS: ORAL_TABLET | ORAL | 0 refills | Status: DC

## 2022-05-14 NOTE — Assessment & Plan Note (Deleted)
Continue statin, f/up in sept

## 2022-05-14 NOTE — Assessment & Plan Note (Signed)
Lab Results  Component Value Date   CHOL 197 09/02/2021   HDL 89 09/02/2021   LDLCALC 86 09/02/2021   TRIG 119 09/02/2021   CHOLHDL 2.2 09/02/2021   Last lipids LDL in normal range The 10-year ASCVD risk score (Arnett DK, et al., 2019) is: 9.2%   Values used to calculate the score:     Age: 51 years     Sex: Male     Is Non-Hispanic African American: Yes     Diabetic: No     Tobacco smoker: Yes     Systolic Blood Pressure: 076 mmHg     Is BP treated: Yes     HDL Cholesterol: 89 mg/dL     Total Cholesterol: 197 mg/dL  ASCVD risk >7.5% statin indicated, pt not taking

## 2022-05-14 NOTE — Patient Instructions (Signed)
Start steroids  You can do benadryl 25-50 mg by mouth up to 4 times a day as needed for severe itching - I recommend maybe only doing at night   Contact Dermatitis Dermatitis is redness, soreness, and swelling (inflammation) of the skin. Contact dermatitis is a reaction to something that touches the skin. There are two types of contact dermatitis: Irritant contact dermatitis. This happens when something bothers (irritates) your skin, like soap. Allergic contact dermatitis. This is caused when you are exposed to something that you are allergic to, such as poison ivy. What are the causes? Common causes of irritant contact dermatitis include: Makeup. Soaps. Detergents. Bleaches. Acids. Metals, such as nickel. Common causes of allergic contact dermatitis include: Plants. Chemicals. Jewelry. Latex. Medicines. Preservatives in products, such as clothing. What increases the risk? Having a job that exposes you to things that bother your skin. Having asthma or eczema. What are the signs or symptoms? Symptoms may happen anywhere the irritant has touched your skin. Symptoms include: Dry or flaky skin. Redness. Cracks. Itching. Pain or a burning feeling. Blisters. Blood or clear fluid draining from skin cracks. With allergic contact dermatitis, swelling may occur. This may happen in places such as the eyelids, mouth, or genitals. How is this treated? This condition is treated by checking for the cause of the reaction and protecting your skin. Treatment may also include: Steroid creams, ointments, or medicines. Antibiotic medicines or other ointments, if you have a skin infection. Lotion or medicines to help with itching. A bandage (dressing). Follow these instructions at home: Skin care Moisturize your skin as needed. Put cool cloths on your skin. Put a baking soda paste on your skin. Stir water into baking soda until it looks like a paste. Do not scratch your skin. Avoid having  things rub up against your skin. Avoid the use of soaps, perfumes, and dyes. Medicines Take or apply over-the-counter and prescription medicines only as told by your doctor. If you were prescribed an antibiotic medicine, take or apply it as told by your doctor. Do not stop using it even if your condition starts to get better. Bathing Take a bath with: Epsom salts. Baking soda. Colloidal oatmeal. Bathe less often. Bathe in warm water. Avoid using hot water. Bandage care If you were given a bandage, change it as told by your doctor. Wash your hands with soap and water before and after you change your bandage. If soap and water are not available, use hand sanitizer. General instructions Avoid the things that caused your reaction. If you do not know what caused it, keep a journal. Write down: What you eat. What skin products you use. What you drink. What you wear in the area that has symptoms. This includes jewelry. Check the affected areas every day for signs of infection. Check for: More redness, swelling, or pain. More fluid or blood. Warmth. Pus or a bad smell. Keep all follow-up visits as told by your doctor. This is important. Contact a doctor if: You do not get better with treatment. Your condition gets worse. You have signs of infection, such as: More swelling. Tenderness. More redness. Soreness. Warmth. You have a fever. You have new symptoms. Get help right away if: You have a very bad headache. You have neck pain. Your neck is stiff. You throw up (vomit). You feel very sleepy. You see red streaks coming from the area. Your bone or joint near the area hurts after the skin has healed. The area turns darker. You have  trouble breathing. Summary Dermatitis is redness, soreness, and swelling of the skin. Symptoms may occur where the irritant has touched you. Treatment may include medicines and skin care. If you do not know what caused your reaction, keep a  journal. Contact a doctor if your condition gets worse or you have signs of infection. This information is not intended to replace advice given to you by your health care provider. Make sure you discuss any questions you have with your health care provider. Document Revised: 09/16/2021 Document Reviewed: 09/16/2021 Elsevier Patient Education  [place].

## 2022-05-14 NOTE — Assessment & Plan Note (Signed)
Still smoking, but attempting to cut back

## 2022-05-14 NOTE — Assessment & Plan Note (Signed)
He reports cutting back on ETOH a significant amount since last OV - BP improved Considering stopping ETOH completely - encouraged him to do so
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
## 2022-05-14 NOTE — Assessment & Plan Note (Signed)
BP well controlled today with poor norvasc compliance due to side effects (urinary frequency) he hasn't taken in about a week and he avoided smoking prior to his appt today, BP is much better controlled that past OV, he also cut back on ETOH and is considering stopping completely He will start monitoring his BP at home due to health insurance requirement  BP today at goal w/o norvasc - will d/c Continue lisinopril 20 mg daily - continue DASH, no ETOH, and reduce smoking Will monitor BP at home Asked pt to do routine f/up by Sept Last labs were normal  BP Readings from Last 3 Encounters:  05/14/22 106/62  09/02/21 136/82  08/14/21 126/82     Chemistry      Component Value Date/Time   NA 139 09/02/2021 1152   K 4.2 09/02/2021 1152   CL 101 09/02/2021 1152   CO2 31 09/02/2021 1152   BUN 15 09/02/2021 1152   CREATININE 1.14 09/02/2021 1152      Component Value Date/Time   CALCIUM 9.8 09/02/2021 1152   AST 18 09/02/2021 1152   ALT 13 09/02/2021 1152   BILITOT 0.4 09/02/2021 1152

## 2022-05-15 DIAGNOSIS — I10 Essential (primary) hypertension: Secondary | ICD-10-CM | POA: Diagnosis not present

## 2022-06-12 ENCOUNTER — Telehealth: Payer: Self-pay | Admitting: Acute Care

## 2022-06-12 NOTE — Telephone Encounter (Signed)
Reached pt to schedule annual LDCT.  Pt wanted to check with wife first.  Pt took program number down to call back.

## 2022-06-14 DIAGNOSIS — I10 Essential (primary) hypertension: Secondary | ICD-10-CM | POA: Diagnosis not present

## 2022-06-18 ENCOUNTER — Other Ambulatory Visit: Payer: Self-pay | Admitting: Family Medicine

## 2022-06-18 DIAGNOSIS — L259 Unspecified contact dermatitis, unspecified cause: Secondary | ICD-10-CM

## 2022-06-30 DIAGNOSIS — R21 Rash and other nonspecific skin eruption: Secondary | ICD-10-CM | POA: Diagnosis not present

## 2022-07-15 DIAGNOSIS — I10 Essential (primary) hypertension: Secondary | ICD-10-CM | POA: Diagnosis not present

## 2022-08-15 DIAGNOSIS — I10 Essential (primary) hypertension: Secondary | ICD-10-CM | POA: Diagnosis not present

## 2022-08-31 DIAGNOSIS — I10 Essential (primary) hypertension: Secondary | ICD-10-CM | POA: Diagnosis not present

## 2022-08-31 DIAGNOSIS — R1013 Epigastric pain: Secondary | ICD-10-CM | POA: Diagnosis not present

## 2022-09-12 ENCOUNTER — Ambulatory Visit: Payer: BC Managed Care – PPO | Admitting: Family Medicine

## 2022-09-14 DIAGNOSIS — I10 Essential (primary) hypertension: Secondary | ICD-10-CM | POA: Diagnosis not present

## 2022-10-15 DIAGNOSIS — I10 Essential (primary) hypertension: Secondary | ICD-10-CM | POA: Diagnosis not present

## 2022-11-14 DIAGNOSIS — I10 Essential (primary) hypertension: Secondary | ICD-10-CM | POA: Diagnosis not present

## 2022-11-28 ENCOUNTER — Telehealth: Payer: Self-pay

## 2022-11-28 NOTE — Telephone Encounter (Signed)
Yes - it is safe - the prescribing provider reviews that before prescribing and the pharmacist also does another check with his current meds to ensure its safe on top of everything else he is taking.  I reviewed his meds and do not see any problems.  He should be done with an old prednisone - that's the only thing I noticed - he shouldn't be on two steroids at the same time (by mouth)   LT

## 2022-11-28 NOTE — Telephone Encounter (Signed)
Pt notified- verbalized understanding.

## 2022-11-28 NOTE — Telephone Encounter (Signed)
Pt walked into clinic today asking if methylprednisolone 4 mg (another provider prescribed this) is safe for him to take on top of his current medications. Please advice

## 2023-03-02 ENCOUNTER — Ambulatory Visit: Payer: BC Managed Care – PPO | Admitting: Physician Assistant

## 2023-03-04 ENCOUNTER — Ambulatory Visit: Payer: BC Managed Care – PPO | Admitting: Physician Assistant

## 2023-03-04 ENCOUNTER — Encounter: Payer: Self-pay | Admitting: Physician Assistant

## 2023-03-04 VITALS — BP 172/104 | HR 81 | Temp 97.7°F | Resp 16 | Ht 69.0 in | Wt 164.4 lb

## 2023-03-04 DIAGNOSIS — R7303 Prediabetes: Secondary | ICD-10-CM | POA: Diagnosis not present

## 2023-03-04 DIAGNOSIS — I10 Essential (primary) hypertension: Secondary | ICD-10-CM

## 2023-03-04 DIAGNOSIS — E785 Hyperlipidemia, unspecified: Secondary | ICD-10-CM

## 2023-03-04 MED ORDER — AMLODIPINE BESYLATE 5 MG PO TABS: 5.0000 mg | ORAL_TABLET | Freq: Every day | ORAL | 2 refills | Status: DC

## 2023-03-04 MED ORDER — LISINOPRIL 40 MG PO TABS: 40.0000 mg | ORAL_TABLET | Freq: Every day | ORAL | 2 refills | Status: DC

## 2023-03-04 NOTE — Assessment & Plan Note (Signed)
Chronic, historic condition Patient is not currently taking statin medication or lipid lowering drugs. Reviewed most recent lipid panel Total cholesterol was 197, LDL was 86 Repeat lipid panel when patient is fasting, orders placed today Results of lab work to dictate further management. Follow-up in 6 months or sooner if concerns arise.

## 2023-03-04 NOTE — Assessment & Plan Note (Signed)
Chronic, historic condition, currently exacerbated Patient reports leading consistently elevated blood pressure readings at home, in 180s/100s He has been taking his lisinopril 20 mg as directed Blood pressure is elevated today in the office so we will be increasing lisinopril to 40 mg/day and adding amlodipine 5 mg p.o. daily Recommend that he takes blood pressure readings daily and keep a record of it to review at next follow-up appointment. Recommend follow-up in 4 weeks to assess response to medication changes and to review blood pressure records

## 2023-03-04 NOTE — Patient Instructions (Signed)
Your blood pressure was elevated today.  If possible please take it at home using an electronic blood pressure cuff for the upper arm Record your blood pressure once per day and bring them back with you to your apt so we can make sure you are not developing high blood pressure.   I have increased your Lisinopril to 40 mg by mouth once per day. You can take 2 tablets of your remaining 20 mg dose to equal 40 mg until that runs out and then start on the 40 mg tablets  I am adding Amlodipine 5 mg to be taken by mouth once per day. This will also help with your blood pressure. It can cause some mild swelling in the lower legs- if this happens please wear compression socks and elevate your feet at night.   Incorporating a minimum of 150 minutes (20-30 minutes per day) of moderate intensity physical activity can help improve your heart health and reduce the chances of high blood pressure and other cardiovascular risks. Incorporating a heart healthy diet can also help reduce the chances of heart attack and high cholesterol.

## 2023-03-04 NOTE — Progress Notes (Signed)
Acute Office Visit   Patient: Calvin Taylor   DOB: 03/14/1971   52 y.o. Male  MRN: [id number] Visit Date: 03/04/2023  Today's healthcare provider: Dani Gobble Hazeline Charnley, PA-C  Introduced myself to the patient as a Journalist, newspaper and provided education on APPs in clinical practice.    Chief Complaint  Patient presents with   Follow-up   Hypertension    Bp been running high, pt would like medication changed   Hyperlipidemia   Gastroesophageal Reflux   Subjective    HPI HPI     Hypertension    Additional comments: Bp been running high, pt would like medication changed      Last edited by Salomon Fick, [place] on 03/04/2023 11:27 AM.       HYPERTENSION / HYPERLIPIDEMIA Satisfied with current treatment? no Duration of hypertension: years BP monitoring frequency: a few times a week BP range: usually getting 180s/ 100s  BP medication side effects: no Past BP meds: lisinopril 20 mg Duration of hyperlipidemia: years Cholesterol medication side effects:  NA Cholesterol supplements: none Past cholesterol medications: none Medication compliance: good compliance Aspirin: no Recent stressors: no Recurrent headaches: no Visual changes: no Palpitations: no Dyspnea: no Chest pain: yes- thinks its related to indigestion and gas Lower extremity edema: no Dizzy/lightheaded: no   He has taken HCTZ in the past but reports he had to stop due to excessive urination    Medications: Outpatient Medications Prior to Visit  Medication Sig   blood glucose meter kit and supplies KIT Dispense based on patient and insurance preference. Use up to four times daily as directed. (FOR ICD-10 E 16.2)   cromolyn (OPTICROM) 4 % ophthalmic solution Place 1 drop into both eyes 4 (four) times daily.   famotidine (PEPCID) 20 MG tablet Take 1 tablet (20 mg total) by mouth 2 (two) times daily as needed for heartburn or indigestion.   ketoconazole (NIZORAL) 2 % shampoo Apply 1 application topically 2  (two) times a week.   tadalafil (CIALIS) 20 MG tablet TAKE 1 TABLET(20 MG) BY MOUTH EVERY OTHER DAY AS NEEDED FOR ERECTILE DYSFUNCTION   [DISCONTINUED] lisinopril (ZESTRIL) 20 MG tablet Take 1 tablet (20 mg total) by mouth daily.   [DISCONTINUED] predniSONE (DELTASONE) 10 MG tablet 6 tabs poqd 1-2, 5 poqd 3-4.[name] Kitchen1 tab poqd 11-12   loratadine (CLARITIN) 10 MG tablet Take 1 tablet (10 mg total) by mouth daily.   No facility-administered medications prior to visit.    Review of Systems  Eyes:  Negative for photophobia and visual disturbance.  Respiratory:  Negative for apnea and shortness of breath.   Cardiovascular:  Negative for chest pain and palpitations.  Gastrointestinal:        Heartburn symptoms from time to time    Neurological:  Negative for dizziness, light-headedness and headaches.    Last CBC Lab Results  Component Value Date   WBC 6.1 08/27/2020   HGB 16.3 08/27/2020   HCT 48.2 08/27/2020   MCV 93.2 08/27/2020   MCH 31.5 08/27/2020   RDW 12.2 08/27/2020   PLT 213 [id number]   Last metabolic panel Lab Results  Component Value Date   GLUCOSE 59 (L) 09/02/2021   NA 139 09/02/2021   K 4.2 09/02/2021   CL 101 09/02/2021   CO2 31 09/02/2021   BUN 15 09/02/2021   CREATININE 1.14 09/02/2021   EGFR 78 09/02/2021   CALCIUM 9.8 09/02/2021   PROT 7.0 09/02/2021   BILITOT 0.4  09/02/2021   AST 18 09/02/2021   ALT 13 09/02/2021   Last lipids Lab Results  Component Value Date   CHOL 197 09/02/2021   HDL 89 09/02/2021   LDLCALC 86 09/02/2021   TRIG 119 09/02/2021   CHOLHDL 2.2 09/02/2021   Last hemoglobin A1c Lab Results  Component Value Date   HGBA1C 5.6 09/02/2021   Last thyroid functions No results found for: "TSH", "T3TOTAL", "T4TOTAL", "THYROIDAB" Last vitamin D No results found for: "25OHVITD2", "25OHVITD3", "VD25OH" Last vitamin B12 and Folate Lab Results  Component Value Date   VITAMINB12 432 03/11/2019   FOLATE 12.5 03/11/2019       Objective     BP (!) 172/104   Pulse 81   Temp 97.7 F (36.5 C) (Oral)   Resp 16   Ht 5\' 9"  (1.753 m)   Wt 164 lb 6.4 oz (74.6 kg)   SpO2 98%   BMI 24.28 kg/m    Physical Exam Vitals reviewed.  Constitutional:      General: He is awake.     Appearance: Normal appearance. He is well-developed and well-groomed.  HENT:     Head: Normocephalic and atraumatic.  Cardiovascular:     Rate and Rhythm: Normal rate and regular rhythm.     Pulses: Normal pulses.          Radial pulses are 2+ on the right side and 2+ on the left side.     Heart sounds: Normal heart sounds. No murmur heard.    No friction rub. No gallop.  Pulmonary:     Effort: Pulmonary effort is normal.     Breath sounds: Normal breath sounds. No decreased air movement. No decreased breath sounds, wheezing, rhonchi or rales.  Musculoskeletal:     Right lower leg: No edema.     Left lower leg: No edema.  Neurological:     Mental Status: He is alert.  Psychiatric:        Attention and Perception: Attention and perception normal.        Mood and Affect: Mood and affect normal.        Speech: Speech normal.        Behavior: Behavior normal. Behavior is cooperative.       No results found for any visits on 03/04/23.  Assessment & Plan      No follow-ups on file.      Problem List Items Addressed This Visit       Cardiovascular and Mediastinum   Essential hypertension - Primary    Chronic, historic condition, currently exacerbated Patient reports leading consistently elevated blood pressure readings at home, in 180s/100s He has been taking his lisinopril 20 mg as directed Blood pressure is elevated today in the office so we will be increasing lisinopril to 40 mg/day and adding amlodipine 5 mg p.o. daily Recommend that he takes blood pressure readings daily and keep a record of it to review at next follow-up appointment. Recommend follow-up in 4 weeks to assess response to medication changes and to review blood  pressure records      Relevant Medications   lisinopril (ZESTRIL) 40 MG tablet   amLODipine (NORVASC) 5 MG tablet   Other Relevant Orders   COMPLETE METABOLIC PANEL WITH GFR   CBC w/Diff/Platelet   TSH     Other   Borderline diabetes mellitus    Chronic, historic condition Previous A1c was 5.6 in 2022 Recheck recommended, lab orders placed today for patient to complete when he  is able to have labs drawn Results to dictate further management Follow-up every year unless concerns or labs indicate otherwise      Relevant Orders   HgB A1c   Hyperlipidemia    Chronic, historic condition Patient is not currently taking statin medication or lipid lowering drugs. Reviewed most recent lipid panel Total cholesterol was 197, LDL was 86 Repeat lipid panel when patient is fasting, orders placed today Results of lab work to dictate further management. Follow-up in 6 months or sooner if concerns arise.      Relevant Medications   lisinopril (ZESTRIL) 40 MG tablet   amLODipine (NORVASC) 5 MG tablet   Other Relevant Orders   Lipid Profile     No follow-ups on file.   I, Orlando Devereux E Sirius Woodford, PA-C, have reviewed all documentation for this visit. The documentation on 03/04/23 for the exam, diagnosis, procedures, and orders are all accurate and complete.   Talitha Givens, MHS, PA-C [facility]

## 2023-03-04 NOTE — Assessment & Plan Note (Signed)
Chronic, historic condition Previous A1c was 5.6 in 2022 Recheck recommended, lab orders placed today for patient to complete when he is able to have labs drawn Results to dictate further management Follow-up every year unless concerns or labs indicate otherwise

## 2023-03-06 DIAGNOSIS — R7303 Prediabetes: Secondary | ICD-10-CM | POA: Diagnosis not present

## 2023-03-06 DIAGNOSIS — E785 Hyperlipidemia, unspecified: Secondary | ICD-10-CM | POA: Diagnosis not present

## 2023-03-06 DIAGNOSIS — I10 Essential (primary) hypertension: Secondary | ICD-10-CM | POA: Diagnosis not present

## 2023-03-07 LAB — COMPLETE METABOLIC PANEL WITH GFR
AG Ratio: 1.6 (calc) (ref 1.0–2.5)
ALT: 8 U/L — ABNORMAL LOW (ref 9–46)
AST: 16 U/L (ref 10–35)
Albumin: 4.5 g/dL (ref 3.6–5.1)
Alkaline phosphatase (APISO): 98 U/L (ref 35–144)
BUN: 13 mg/dL (ref 7–25)
CO2: 30 mmol/L (ref 20–32)
Calcium: 10.2 mg/dL (ref 8.6–10.3)
Chloride: 100 mmol/L (ref 98–110)
Creat: 1.16 mg/dL (ref 0.70–1.30)
Globulin: 2.9 g/dL (calc) (ref 1.9–3.7)
Glucose, Bld: 88 mg/dL (ref 65–99)
Potassium: 4.5 mmol/L (ref 3.5–5.3)
Sodium: 140 mmol/L (ref 135–146)
Total Bilirubin: 0.5 mg/dL (ref 0.2–1.2)
Total Protein: 7.4 g/dL (ref 6.1–8.1)
eGFR: 76 mL/min/{1.73_m2} (ref >=60)

## 2023-03-07 LAB — LIPID PANEL
Cholesterol: 216 mg/dL — ABNORMAL HIGH (ref <200)
HDL: 75 mg/dL (ref >=40)
LDL Cholesterol (Calc): 122 mg/dL (calc) — ABNORMAL HIGH
Non-HDL Cholesterol (Calc): 141 mg/dL (calc) — ABNORMAL HIGH (ref <130)
Total CHOL/HDL Ratio: 2.9 (calc) (ref <5.0)
Triglycerides: 87 mg/dL (ref <150)

## 2023-03-07 LAB — CBC WITH DIFFERENTIAL/PLATELET
Absolute Monocytes: 647 cells/uL (ref 200–950)
Basophils Absolute: 40 cells/uL (ref 0–200)
Basophils Relative: 0.6 %
Eosinophils Absolute: 99 cells/uL (ref 15–500)
Eosinophils Relative: 1.5 %
HCT: 47.9 % (ref 38.5–50.0)
Hemoglobin: 16.1 g/dL (ref 13.2–17.1)
Lymphs Abs: 1848 cells/uL (ref 850–3900)
MCH: 31.2 pg (ref 27.0–33.0)
MCHC: 33.6 g/dL (ref 32.0–36.0)
MCV: 92.8 fL (ref 80.0–100.0)
MPV: 10.2 fL (ref 7.5–12.5)
Monocytes Relative: 9.8 %
Neutro Abs: 3967 cells/uL (ref 1500–7800)
Neutrophils Relative %: 60.1 %
Platelets: 243 10*3/uL (ref 140–400)
RBC: 5.16 10*6/uL (ref 4.20–5.80)
RDW: 12.3 % (ref 11.0–15.0)
Total Lymphocyte: 28 %
WBC: 6.6 10*3/uL (ref 3.8–10.8)

## 2023-03-07 LAB — HEMOGLOBIN A1C
Hgb A1c MFr Bld: 6.1 % of total Hgb — ABNORMAL HIGH (ref <5.7)
Mean Plasma Glucose: 128 mg/dL
eAG (mmol/L): 7.1 mmol/L

## 2023-03-07 LAB — TSH: TSH: 1.38 mIU/L (ref 0.40–4.50)

## 2023-03-09 NOTE — Progress Notes (Signed)
Your electrolytes, liver and kidney function, are in normal ranges  Your cholesterol is elevated and you have an increased risk of developing cardiovascular disease if this is not better controlled. I recommend that we start a cholesterol medication to lower your cholesterol and disease risk.  The 10-year ASCVD risk score (Arnett DK, et al., 2019) is: 24.3%   Values used to calculate the score:     Age: 52 years     Sex: Male     Is Non-Hispanic African American: Yes     Diabetic: No     Tobacco smoker: Yes     Systolic Blood Pressure: [id number] mmHg     Is BP treated: Yes     HDL Cholesterol: 75 mg/dL     Total Cholesterol: 216 mg/dL  Your A1c has increased to 6.1 - this is still in the prediabetic range but I do recommend instituting diet and exercise changes to help control this better. Please decrease your excess sugar and carbohydrate intake to lower the risk of progressing to diabetes.  Your CBC did not show evidence of anemia or elevated white count which can sometimes indicate infection Your thyroid testing was normal.   Please let us know if you have further questions and if you would like to start that cholesterol medication.

## 2023-03-12 ENCOUNTER — Ambulatory Visit: Payer: BC Managed Care – PPO | Admitting: Family Medicine

## 2023-03-12 NOTE — Progress Notes (Deleted)
Patient ID: Calvin Taylor, male    DOB: 11-Feb-1971, 52 y.o.   MRN: [id number]  PCP: Delsa Grana, PA-C  No chief complaint on file.   Subjective:   Calvin Taylor is a 52 y.o. male, presents to clinic with CC of the following:  HPI    Patient Active Problem List   Diagnosis Date Noted   Smokes less than 1 pack a day with greater than 30 pack year history 03/25/2021   Hyperlipidemia 03/25/2021   Erectile dysfunction 03/12/2020   Current smoker 03/12/2020   Medial epicondylitis of right elbow 07/26/2019   Gastroesophageal reflux disease without esophagitis 04/21/2019   Cyst, dermoid, scalp and neck 04/21/2019   Alcohol abuse 04/21/2019   Sebaceous cyst 03/16/2019   Borderline diabetes mellitus 04/30/2018   Essential hypertension 04/28/2018      Current Outpatient Medications:    amLODipine (NORVASC) 5 MG tablet, Take 1 tablet (5 mg total) by mouth daily., Disp: 30 tablet, Rfl: 2   blood glucose meter kit and supplies KIT, Dispense based on patient and insurance preference. Use up to four times daily as directed. (FOR ICD-10 E 16.2), Disp: 1 each, Rfl: 0   cromolyn (OPTICROM) 4 % ophthalmic solution, Place 1 drop into both eyes 4 (four) times daily., Disp: 10 mL, Rfl: 2   famotidine (PEPCID) 20 MG tablet, Take 1 tablet (20 mg total) by mouth 2 (two) times daily as needed for heartburn or indigestion., Disp: 180 tablet, Rfl: 1   ketoconazole (NIZORAL) 2 % shampoo, Apply 1 application topically 2 (two) times a week., Disp: 120 mL, Rfl: 2   lisinopril (ZESTRIL) 40 MG tablet, Take 1 tablet (40 mg total) by mouth daily., Disp: 30 tablet, Rfl: 2   loratadine (CLARITIN) 10 MG tablet, Take 1 tablet (10 mg total) by mouth daily., Disp: 90 tablet, Rfl: 3   tadalafil (CIALIS) 20 MG tablet, TAKE 1 TABLET(20 MG) BY MOUTH EVERY OTHER DAY AS NEEDED FOR ERECTILE DYSFUNCTION, Disp: 10 tablet, Rfl: 1   No Known Allergies   Social History   Tobacco Use   Smoking status: Every Day     Packs/day: 1.00    Years: 33.00    Additional pack years: 0.00    Total pack years: 33.00    Types: Cigarettes   Smokeless tobacco: Never   Tobacco comments:    Smoked 2ppd for 26 years, started smoking 52 y/o, 56 pack year hx  Vaping Use   Vaping Use: Never used  Substance Use Topics   Alcohol use: Yes    Alcohol/week: 21.0 standard drinks of alcohol    Types: 21 Cans of beer per week    Comment: per day   Drug use: No      Chart Review Today: ***  Review of Systems     Objective:   There were no vitals filed for this visit.  There is no height or weight on file to calculate BMI.  Physical Exam   Results for orders placed or performed in visit on 03/04/23  COMPLETE METABOLIC PANEL WITH GFR  Result Value Ref Range   Glucose, Bld 88 65 - 99 mg/dL   BUN 13 7 - 25 mg/dL   Creat 1.16 0.70 - 1.30 mg/dL   eGFR 76 > OR = 60 mL/min/1.74m2   BUN/Creatinine Ratio SEE NOTE: 6 - 22 (calc)   Sodium 140 135 - 146 mmol/L   Potassium 4.5 3.5 - 5.3 mmol/L   Chloride 100 98 - 110 mmol/L  CO2 30 20 - 32 mmol/L   Calcium 10.2 8.6 - 10.3 mg/dL   Total Protein 7.4 6.1 - 8.1 g/dL   Albumin 4.5 3.6 - 5.1 g/dL   Globulin 2.9 1.9 - 3.7 g/dL (calc)   AG Ratio 1.6 1.0 - 2.5 (calc)   Total Bilirubin 0.5 0.2 - 1.2 mg/dL   Alkaline phosphatase (APISO) 98 35 - 144 U/L   AST 16 10 - 35 U/L   ALT 8 (L) 9 - 46 U/L  CBC w/Diff/Platelet  Result Value Ref Range   WBC 6.6 3.8 - 10.8 Thousand/uL   RBC 5.16 4.20 - 5.80 Million/uL   Hemoglobin 16.1 13.2 - 17.1 g/dL   HCT 47.9 38.5 - 50.0 %   MCV 92.8 80.0 - 100.0 fL   MCH 31.2 27.0 - 33.0 pg   MCHC 33.6 32.0 - 36.0 g/dL   RDW 12.3 11.0 - 15.0 %   Platelets 243 140 - 400 Thousand/uL   MPV 10.2 7.5 - 12.5 fL   Neutro Abs 3,967 1,500 - 7,800 cells/uL   Lymphs Abs 1,848 850 - 3,900 cells/uL   Absolute Monocytes 647 200 - 950 cells/uL   Eosinophils Absolute 99 15 - 500 cells/uL   Basophils Absolute 40 0 - 200 cells/uL   Neutrophils Relative %  60.1 %   Total Lymphocyte 28.0 %   Monocytes Relative 9.8 %   Eosinophils Relative 1.5 %   Basophils Relative 0.6 %  Lipid Profile  Result Value Ref Range   Cholesterol 216 (H) <200 mg/dL   HDL 75 > OR = 40 mg/dL   Triglycerides 87 <150 mg/dL   LDL Cholesterol (Calc) 122 (H) mg/dL (calc)   Total CHOL/HDL Ratio 2.9 <5.0 (calc)   Non-HDL Cholesterol (Calc) 141 (H) <130 mg/dL (calc)  TSH  Result Value Ref Range   TSH 1.38 0.40 - 4.50 mIU/L  HgB A1c  Result Value Ref Range   Hgb A1c MFr Bld 6.1 (H) <5.7 % of total Hgb   Mean Plasma Glucose 128 mg/dL   eAG (mmol/L) 7.1 mmol/L       Assessment & Plan:   ***     Delsa Grana, PA-C 03/12/23 3:19 PM

## 2023-03-16 ENCOUNTER — Encounter: Payer: Self-pay | Admitting: Physician Assistant

## 2023-03-16 ENCOUNTER — Ambulatory Visit: Payer: BC Managed Care – PPO | Admitting: Physician Assistant

## 2023-03-16 VITALS — BP 122/80 | HR 96 | Temp 97.5°F | Resp 16 | Ht 69.0 in | Wt 164.7 lb

## 2023-03-16 DIAGNOSIS — I10 Essential (primary) hypertension: Secondary | ICD-10-CM | POA: Diagnosis not present

## 2023-03-16 DIAGNOSIS — E785 Hyperlipidemia, unspecified: Secondary | ICD-10-CM | POA: Diagnosis not present

## 2023-03-16 MED ORDER — ATORVASTATIN CALCIUM 10 MG PO TABS: 10.0000 mg | ORAL_TABLET | Freq: Every day | ORAL | 1 refills | Status: DC

## 2023-03-16 NOTE — Progress Notes (Signed)
Acute Office Visit   Patient: Calvin Taylor   DOB: May 14, 1971   52 y.o. Male  MRN: [id number] Visit Date: 03/16/2023  Today's healthcare provider: Dani Gobble Kasper Mudrick, PA-C  Introduced myself to the patient as a Journalist, newspaper and provided education on APPs in clinical practice.    Chief Complaint  Patient presents with   Form Completion   Subjective    HPI   He reports he has been given an accommodation form due to HTN   Hypertension: - Medications: amlodipine 5 mg po QD, Lisinopril 40 mg PO QD  - Compliance: excellent  - Checking BP at home: yes, BP has been 130s-140s/ 90s on wrist meter   - Denies any SOB, CP, vision changes, LE edema, medication SEs, or symptoms of hypotension - Diet: normal dietary intake  - Exercise: he has an active job    We discussed starting a statin today - he is amenable to this  Reviewed potential side effects and MOA on cholesterol to provide CV protection   Medications: Outpatient Medications Prior to Visit  Medication Sig   amLODipine (NORVASC) 5 MG tablet Take 1 tablet (5 mg total) by mouth daily.   blood glucose meter kit and supplies KIT Dispense based on patient and insurance preference. Use up to four times daily as directed. (FOR ICD-10 E 16.2)   cromolyn (OPTICROM) 4 % ophthalmic solution Place 1 drop into both eyes 4 (four) times daily.   famotidine (PEPCID) 20 MG tablet Take 1 tablet (20 mg total) by mouth 2 (two) times daily as needed for heartburn or indigestion.   ketoconazole (NIZORAL) 2 % shampoo Apply 1 application topically 2 (two) times a week.   lisinopril (ZESTRIL) 40 MG tablet Take 1 tablet (40 mg total) by mouth daily.   tadalafil (CIALIS) 20 MG tablet TAKE 1 TABLET(20 MG) BY MOUTH EVERY OTHER DAY AS NEEDED FOR ERECTILE DYSFUNCTION   loratadine (CLARITIN) 10 MG tablet Take 1 tablet (10 mg total) by mouth daily.   No facility-administered medications prior to visit.    Review of Systems  Eyes:  Negative for photophobia  and visual disturbance.  Cardiovascular:  Negative for chest pain, palpitations and leg swelling.  Neurological:  Negative for dizziness and headaches.       Objective    BP 122/80   Pulse 96   Temp (!) 97.5 F (36.4 C) (Oral)   Resp 16   Ht 5\' 9"  (1.753 m)   Wt 164 lb 11.2 oz (74.7 kg)   SpO2 99%   BMI 24.32 kg/m    Physical Exam Vitals reviewed.  Constitutional:      Appearance: Normal appearance.  HENT:     Head: Normocephalic and atraumatic.  Eyes:     Extraocular Movements: Extraocular movements intact.     Conjunctiva/sclera: Conjunctivae normal.     Pupils: Pupils are equal, round, and reactive to light.  Cardiovascular:     Rate and Rhythm: Normal rate and regular rhythm.     Pulses: Normal pulses.     Heart sounds: Normal heart sounds. No murmur heard.    No friction rub. No gallop.  Pulmonary:     Effort: Pulmonary effort is normal.     Breath sounds: Normal breath sounds.  Musculoskeletal:        General: Normal range of motion.  Neurological:     General: No focal deficit present.     Mental Status: He is alert and  oriented to person, place, and time. Mental status is at baseline.  Psychiatric:        Mood and Affect: Mood normal.        Behavior: Behavior normal.        Thought Content: Thought content normal.        Judgment: Judgment normal.       No results found for any visits on 03/16/23.  Assessment & Plan      Return in about 3 months (around 06/15/2023) for HTN, HLD - repeat labs and recheck BP .     Problem List Items Addressed This Visit       Cardiovascular and Mediastinum   Essential hypertension    Chronic, historic condition Appears to be improving from 03/04/23 based on home readings and office results  BP in office is within goal He is taking Lisinopril 40 mg PO QD and Amlodipine 5 mg PO QD - continue current medications Recommend that he continues to measure BP at home- recommend using arm cuff rather than wrist cuff for  more accurate measures Follow up in 3 months or sooner if concerns arise       Relevant Medications   atorvastatin (LIPITOR) 10 MG tablet     Other   Hyperlipidemia - Primary    Chronic, historic condition Reviewed his most recent labs and indications for starting a statin He is amenable to starting one Script for Atorvastatin 10 mg PO QD sent in Recommend follow up in 3 months for repeat labs and monitoring       Relevant Medications   atorvastatin (LIPITOR) 10 MG tablet     Return in about 3 months (around 06/15/2023) for HTN, HLD - repeat labs and recheck BP .   I, Calvin Chirco E Finlay Mills, PA-C, have reviewed all documentation for this visit. The documentation on 03/16/23 for the exam, diagnosis, procedures, and orders are all accurate and complete.   Calvin Taylor, MHS, PA-C [facility]

## 2023-03-16 NOTE — Assessment & Plan Note (Signed)
Chronic, historic condition Reviewed his most recent labs and indications for starting a statin He is amenable to starting one Script for Atorvastatin 10 mg PO QD sent in Recommend follow up in 3 months for repeat labs and monitoring

## 2023-03-16 NOTE — Assessment & Plan Note (Signed)
Chronic, historic condition Appears to be improving from 03/04/23 based on home readings and office results  BP in office is within goal He is taking Lisinopril 40 mg PO QD and Amlodipine 5 mg PO QD - continue current medications Recommend that he continues to measure BP at home- recommend using arm cuff rather than wrist cuff for more accurate measures Follow up in 3 months or sooner if concerns arise

## 2023-05-01 ENCOUNTER — Other Ambulatory Visit: Payer: Self-pay

## 2023-05-01 ENCOUNTER — Encounter: Payer: Self-pay | Admitting: Nurse Practitioner

## 2023-05-01 ENCOUNTER — Ambulatory Visit: Payer: BC Managed Care – PPO | Admitting: Nurse Practitioner

## 2023-05-01 VITALS — BP 130/80 | HR 69 | Temp 98.2°F | Resp 18 | Ht 69.0 in | Wt 167.3 lb

## 2023-05-01 DIAGNOSIS — L0291 Cutaneous abscess, unspecified: Secondary | ICD-10-CM | POA: Diagnosis not present

## 2023-05-01 MED ORDER — DOXYCYCLINE HYCLATE 100 MG PO TABS
100.0000 mg | ORAL_TABLET | Freq: Two times a day (BID) | ORAL | 0 refills | Status: AC
Start: 2023-05-01 — End: 2023-05-08

## 2023-05-01 NOTE — Progress Notes (Signed)
BP 130/80   Pulse 69   Temp 98.2 F (36.8 C) (Oral)   Resp 18   Ht 5\' 9"  (1.753 m)   Wt 167 lb 4.8 oz (75.9 kg)   SpO2 98%   BMI 24.71 kg/m    Subjective:    Patient ID: Calvin Taylor, male    DOB: 06/21/1971, 52 y.o.   MRN: [id number]  HPI: Calvin Taylor is a 52 y.o. male  Chief Complaint  Patient presents with   Recurrent Skin Infections    Boil in groin since yesterday   Abscess: he has an abscess on his left testicle.  He says he noticed it yesterday it is hard about the size of a dime.  No redness or drainage.  He reports he has had them here in the past.  He says that he usually soaks them in epison salt and it gets better. He says it is hard and hurts when he walks.  Will start doxycycline if no improvement will need to send to urology.   Relevant past medical, surgical, family and social history reviewed and updated as indicated. Interim medical history since our last visit reviewed. Allergies and medications reviewed and updated.  Review of Systems  Constitutional: Negative for fever or weight change.  Respiratory: Negative for cough and shortness of breath.   Cardiovascular: Negative for chest pain or palpitations.  Gastrointestinal: Negative for abdominal pain, no bowel changes.  Musculoskeletal: Negative for gait problem or joint swelling.  Skin: Negative for rash. Positive for abscess Neurological: Negative for dizziness or headache.  No other specific complaints in a complete review of systems (except as listed in HPI above).      Objective:    BP 130/80   Pulse 69   Temp 98.2 F (36.8 C) (Oral)   Resp 18   Ht 5\' 9"  (1.753 m)   Wt 167 lb 4.8 oz (75.9 kg)   SpO2 98%   BMI 24.71 kg/m   Wt Readings from Last 3 Encounters:  05/01/23 167 lb 4.8 oz (75.9 kg)  03/16/23 164 lb 11.2 oz (74.7 kg)  03/04/23 164 lb 6.4 oz (74.6 kg)    Physical Exam  Constitutional: Patient appears well-developed and well-nourished.  No distress.  HEENT: head atraumatic,  normocephalic, pupils equal and reactive to light, neck supple, throat within normal limits Cardiovascular: Normal rate, regular rhythm and normal heart sounds.  No murmur heard. No BLE edema. Pulmonary/Chest: Effort normal and breath sounds normal. No respiratory distress. Abdominal: Soft.  There is no tenderness. Skin: abscess about the size of a dime on left testicle Psychiatric: Patient has a normal mood and affect. behavior is normal. Judgment and thought content normal.  Results for orders placed or performed in visit on 03/04/23  COMPLETE METABOLIC PANEL WITH GFR  Result Value Ref Range   Glucose, Bld 88 65 - 99 mg/dL   BUN 13 7 - 25 mg/dL   Creat 4.09 8.11 - 9.14 mg/dL   eGFR 76 > OR = 60 NW/GNF/6.21H0   BUN/Creatinine Ratio SEE NOTE: 6 - 22 (calc)   Sodium 140 135 - 146 mmol/L   Potassium 4.5 3.5 - 5.3 mmol/L   Chloride 100 98 - 110 mmol/L   CO2 30 20 - 32 mmol/L   Calcium 10.2 8.6 - 10.3 mg/dL   Total Protein 7.4 6.1 - 8.1 g/dL   Albumin 4.5 3.6 - 5.1 g/dL   Globulin 2.9 1.9 - 3.7 g/dL (calc)   AG Ratio 1.6 1.0 -  2.5 (calc)   Total Bilirubin 0.5 0.2 - 1.2 mg/dL   Alkaline phosphatase (APISO) 98 35 - 144 U/L   AST 16 10 - 35 U/L   ALT 8 (L) 9 - 46 U/L  CBC w/Diff/Platelet  Result Value Ref Range   WBC 6.6 3.8 - 10.8 Thousand/uL   RBC 5.16 4.20 - 5.80 Million/uL   Hemoglobin 16.1 13.2 - 17.1 g/dL   HCT 16.1 09.6 - 04.5 %   MCV 92.8 80.0 - 100.0 fL   MCH 31.2 27.0 - 33.0 pg   MCHC 33.6 32.0 - 36.0 g/dL   RDW 40.9 81.1 - 91.4 %   Platelets 243 140 - 400 Thousand/uL   MPV 10.2 7.5 - 12.5 fL   Neutro Abs 3,967 1,500 - 7,800 cells/uL   Lymphs Abs 1,848 850 - 3,900 cells/uL   Absolute Monocytes 647 200 - 950 cells/uL   Eosinophils Absolute 99 15 - 500 cells/uL   Basophils Absolute 40 0 - 200 cells/uL   Neutrophils Relative % 60.1 %   Total Lymphocyte 28.0 %   Monocytes Relative 9.8 %   Eosinophils Relative 1.5 %   Basophils Relative 0.6 %  Lipid Profile  Result  Value Ref Range   Cholesterol 216 (H) <200 mg/dL   HDL 75 > OR = 40 mg/dL   Triglycerides 87 <782 mg/dL   LDL Cholesterol (Calc) 122 (H) mg/dL (calc)   Total CHOL/HDL Ratio 2.9 <5.0 (calc)   Non-HDL Cholesterol (Calc) 141 (H) <130 mg/dL (calc)  TSH  Result Value Ref Range   TSH 1.38 0.40 - 4.50 mIU/L  HgB A1c  Result Value Ref Range   Hgb A1c MFr Bld 6.1 (H) <5.7 % of total Hgb   Mean Plasma Glucose 128 mg/dL   eAG (mmol/L) 7.1 mmol/L      Assessment & Plan:   Problem List Items Addressed This Visit   None Visit Diagnoses     Abscess    -  Primary   start doxycycline, continue warm compresses, if no improvement will need to refer to urology   Relevant Medications   doxycycline (VIBRA-TABS) 100 MG tablet        Follow up plan: Return if symptoms worsen or fail to improve.

## 2023-05-17 ENCOUNTER — Other Ambulatory Visit: Payer: Self-pay | Admitting: Family Medicine

## 2023-05-17 DIAGNOSIS — I10 Essential (primary) hypertension: Secondary | ICD-10-CM

## 2023-05-30 ENCOUNTER — Other Ambulatory Visit: Payer: Self-pay | Admitting: Physician Assistant

## 2023-05-30 DIAGNOSIS — I10 Essential (primary) hypertension: Secondary | ICD-10-CM

## 2023-06-01 NOTE — Telephone Encounter (Signed)
Requested medications are due for refill today.  unsure  Requested medications are on the active medications list.  yes  Last refill. See note  Future visit scheduled.   yes  Notes to clinic.  Pt has both 20 mg and 40 mg doses on med list.    Requested Prescriptions  Pending Prescriptions Disp Refills   lisinopril (ZESTRIL) 40 MG tablet [Pharmacy Med Name: LISINOPRIL 40MG  TABLETS] 30 tablet 2    Sig: TAKE 1 TABLET(40 MG) BY MOUTH DAILY     Cardiovascular:  ACE Inhibitors Passed - 05/30/2023 10:08 AM      Passed - Cr in normal range and within 180 days    Creat  Date Value Ref Range Status  03/06/2023 1.16 0.70 - 1.30 mg/dL Final         Passed - K in normal range and within 180 days    Potassium  Date Value Ref Range Status  03/06/2023 4.5 3.5 - 5.3 mmol/L Final         Passed - Patient is not pregnant      Passed - Last BP in normal range    BP Readings from Last 1 Encounters:  05/01/23 130/80         Passed - Valid encounter within last 6 months    Recent Outpatient Visits           1 month ago Abscess   [facility] Berniece Salines, FNP   2 months ago Hyperlipidemia, unspecified hyperlipidemia type   [facility] Mecum, Oswaldo Conroy, PA-C   2 months ago Essential hypertension   [facility] Mecum, Oswaldo Conroy, PA-C   1 year ago Contact dermatitis, unspecified contact dermatitis type, unspecified trigger   [facility] Danelle Berry, PA-C   1 year ago Essential hypertension   [facility] Danelle Berry, PA-C       Future Appointments             In 3 days Danelle Berry, PA-C [facility]   In 2 weeks Danelle Berry, PA-C [facility], [facility]            Signed Prescriptions Disp Refills   amLODipine (NORVASC) 5 MG tablet 90 tablet 1    Sig: TAKE 1 TABLET(5 MG) BY MOUTH DAILY      Cardiovascular: Calcium Channel Blockers 2 Passed - 05/30/2023 10:08 AM      Passed - Last BP in normal range    BP Readings from Last 1 Encounters:  05/01/23 130/80         Passed - Last Heart Rate in normal range    Pulse Readings from Last 1 Encounters:  05/01/23 69         Passed - Valid encounter within last 6 months    Recent Outpatient Visits           1 month ago Abscess   [facility] Berniece Salines, FNP   2 months ago Hyperlipidemia, unspecified hyperlipidemia type   [facility] Mecum, Oswaldo Conroy, PA-C   2 months ago Essential hypertension   [facility] Mecum, Oswaldo Conroy, PA-C   1 year ago Contact dermatitis, unspecified contact dermatitis type, unspecified trigger   [facility] Danelle Berry, PA-C   1 year ago Essential hypertension   [facility] Danelle Berry, PA-C       Future Appointments             In 3 days Danelle Berry, PA-C [facility], [facility]   In 2 weeks Danelle Berry, PA-C [facility], [facility]

## 2023-06-01 NOTE — Telephone Encounter (Signed)
Requested Prescriptions  Pending Prescriptions Disp Refills   amLODipine (NORVASC) 5 MG tablet [Pharmacy Med Name: AMLODIPINE BESYLATE 5MG  TABLETS] 90 tablet 1    Sig: TAKE 1 TABLET(5 MG) BY MOUTH DAILY     Cardiovascular: Calcium Channel Blockers 2 Passed - 05/30/2023 10:08 AM      Passed - Last BP in normal range    BP Readings from Last 1 Encounters:  05/01/23 130/80         Passed - Last Heart Rate in normal range    Pulse Readings from Last 1 Encounters:  05/01/23 69         Passed - Valid encounter within last 6 months    Recent Outpatient Visits           1 month ago Abscess   [facility] Della Goo F, FNP   2 months ago Hyperlipidemia, unspecified hyperlipidemia type   [facility] Mecum, Oswaldo Conroy, PA-C   2 months ago Essential hypertension   [facility] Mecum, Oswaldo Conroy, PA-C   1 year ago Contact dermatitis, unspecified contact dermatitis type, unspecified trigger   [facility] Danelle Berry, PA-C   1 year ago Essential hypertension   [facility] Danelle Berry, PA-C       Future Appointments             In 3 days Danelle Berry, PA-C [facility]   In 2 weeks Danelle Berry, PA-C [facility]             lisinopril (ZESTRIL) 40 MG tablet [Pharmacy Med Name: LISINOPRIL 40MG  TABLETS] 30 tablet 2    Sig: TAKE 1 TABLET(40 MG) BY MOUTH DAILY     Cardiovascular:  ACE Inhibitors Passed - 05/30/2023 10:08 AM      Passed - Cr in normal range and within 180 days    Creat  Date Value Ref Range Status  03/06/2023 1.16 0.70 - 1.30 mg/dL Final         Passed - K in normal range and within 180 days    Potassium  Date Value Ref Range Status  03/06/2023 4.5 3.5 - 5.3 mmol/L Final         Passed - Patient is not pregnant      Passed - Last BP in normal range    BP Readings from  Last 1 Encounters:  05/01/23 130/80         Passed - Valid encounter within last 6 months    Recent Outpatient Visits           1 month ago Abscess   [facility] Berniece Salines, FNP   2 months ago Hyperlipidemia, unspecified hyperlipidemia type   [facility] Mecum, Oswaldo Conroy, PA-C   2 months ago Essential hypertension   [facility] [facility] Mecum, Oswaldo Conroy, PA-C   1 year ago Contact dermatitis, unspecified contact dermatitis type, unspecified trigger   [facility] Danelle Berry, PA-C   1 year ago Essential hypertension   [facility] Danelle Berry, PA-C       Future Appointments             In 3 days Danelle Berry, PA-C [facility]   In 2 weeks Danelle Berry, PA-C [facility],  [facility]

## 2023-06-03 NOTE — Progress Notes (Signed)
Name: Calvin Taylor   MRN: [id number]    DOB: 07/06/1971   Date:06/04/2023       Progress Note  Chief Complaint  Patient presents with   Follow-up   Hypertension   Hyperlipidemia   Gastroesophageal Reflux     Subjective:   Calvin Taylor is a 52 y.o. male, presents to clinic for routine f/up on chronic conditions I last saw him about a year ago He has since seen other providers in clinic for chronic and acute issues  Hypertension:  Currently managed on  lisinopril 40 mg and amplodipine 5 mg Pt reports god med compliance and denies any SE.   Blood pressure today is slightly elevated BP Readings from Last 6 Encounters:  06/04/23 (!) 140/82  05/01/23 130/80  03/16/23 122/80  03/04/23 (!) 172/104  05/14/22 106/62  09/02/21 136/82   Pt denies CP, SOB, exertional sx, LE edema, palpitation, Ha's, visual disturbances, lightheadedness, hypotension, syncope. Dietary efforts for BP?   Managing stress,  less fast food/fried food   ED -  tadalafil 10-20 mg, not using often    GERD-  on pepcid bid prn, flares up intermittently depending on food triggers/stress   Seasonal allergies worse in spring, still on claritin and using pataday drops OTC, seems to be well controlled right now   Hyperlipidemia: recently started statin in March ASCVD score was very high and pt agreed - lipitor 10 mg - he's not sure if he's taking it or not - doesn't have meds with him  Last Lipids: Lab Results  Component Value Date   CHOL 216 (H) 03/06/2023   HDL 75 03/06/2023   LDLCALC 122 (H) 03/06/2023   TRIG 87 03/06/2023   CHOLHDL 2.9 03/06/2023   - Denies: Chest pain, shortness of breath, myalgias, claudication   Current smoker 1/2 ppd, smokes more when driving, but overall driving less, about 30 years of smoking, 1/2 ppd for most of that time, 15 pack year hx, denies any chronic cough, will have intermittent cough, no SOB, DOE Did LDCT lung cancer screening this year  04/22/2021 imaging reviewed  today  - Lungs/Pleura: Mild paraseptal emphysematous changes in the upper lobes.   Hx of prediabetes Lab Results  Component Value Date   HGBA1C 6.1 (H) 03/06/2023   HGBA1C 5.6 09/02/2021   HGBA1C 5.5 08/27/2020   HGBA1C 5.4 03/12/2020   HGBA1C 5.7 (H) 03/11/2019     Current Outpatient Medications:    amLODipine (NORVASC) 5 MG tablet, TAKE 1 TABLET(5 MG) BY MOUTH DAILY, Disp: 90 tablet, Rfl: 1   atorvastatin (LIPITOR) 10 MG tablet, Take 1 tablet (10 mg total) by mouth daily., Disp: 90 tablet, Rfl: 1   blood glucose meter kit and supplies KIT, Dispense based on patient and insurance preference. Use up to four times daily as directed. (FOR ICD-10 E 16.2), Disp: 1 each, Rfl: 0   cromolyn (OPTICROM) 4 % ophthalmic solution, Place 1 drop into both eyes 4 (four) times daily., Disp: 10 mL, Rfl: 2   famotidine (PEPCID) 20 MG tablet, Take 1 tablet (20 mg total) by mouth 2 (two) times daily as needed for heartburn or indigestion., Disp: 180 tablet, Rfl: 1   ketoconazole (NIZORAL) 2 % shampoo, Apply 1 application topically 2 (two) times a week., Disp: 120 mL, Rfl: 2   lisinopril (ZESTRIL) 40 MG tablet, Take 1 tablet (40 mg total) by mouth daily., Disp: 30 tablet, Rfl: 2   meloxicam (MOBIC) 7.5 MG tablet, Take 7.5 mg by mouth 2 (two)  times daily., Disp: , Rfl:    tadalafil (CIALIS) 20 MG tablet, TAKE 1 TABLET(20 MG) BY MOUTH EVERY OTHER DAY AS NEEDED FOR ERECTILE DYSFUNCTION, Disp: 10 tablet, Rfl: 1   loratadine (CLARITIN) 10 MG tablet, Take 1 tablet (10 mg total) by mouth daily., Disp: 90 tablet, Rfl: 3  Patient Active Problem List   Diagnosis Date Noted   Smokes less than 1 pack a day with greater than 30 pack year history 03/25/2021   Hyperlipidemia 03/25/2021   Erectile dysfunction 03/12/2020   Current smoker 03/12/2020   Medial epicondylitis of right elbow 07/26/2019   Gastroesophageal reflux disease without esophagitis 04/21/2019   Cyst, dermoid, scalp and neck 04/21/2019   Alcohol abuse  04/21/2019   Sebaceous cyst 03/16/2019   Borderline diabetes mellitus 04/30/2018   Essential hypertension 04/28/2018    Past Surgical History:  Procedure Laterality Date   COLONOSCOPY WITH PROPOFOL N/A 04/29/2021   Procedure: COLONOSCOPY WITH PROPOFOL;  Surgeon: Toney Reil, MD;  Location: [facility]COPY;  Service: Gastroenterology;  Laterality: N/A;   right eye surgery      Family History  Problem Relation Age of Onset   Hypertension Mother    Hypertension Father     Social History   Tobacco Use   Smoking status: Every Day    Packs/day: 1.00    Years: 33.00    Additional pack years: 0.00    Total pack years: 33.00    Types: Cigarettes   Smokeless tobacco: Never   Tobacco comments:    Smoked 2ppd for 26 years, started smoking 52 y/o, 56 pack year hx  Vaping Use   Vaping Use: Never used  Substance Use Topics   Alcohol use: Yes    Alcohol/week: 21.0 standard drinks of alcohol    Types: 21 Cans of beer per week    Comment: per day   Drug use: No     No Known Allergies  Health Maintenance  Topic Date Due   Zoster Vaccines- Shingrix (1 of 2) 06/15/2023 (Originally 02/06/1990)   COVID-19 Vaccine (2 - Pfizer risk series) 06/20/2023 (Originally 02/04/2021)   Lung Cancer Screening  06/03/2024 (Originally 04/22/2022)   INFLUENZA VACCINE  07/16/2023   Colonoscopy  04/29/2026   DTaP/Tdap/Td (2 - Td or Tdap) 03/10/2029   Hepatitis C Screening  Completed   HIV Screening  Completed   HPV VACCINES  Aged Out    Chart Review Today: I personally reviewed active problem list, medication list, allergies, family history, social history, health maintenance, notes from last encounter, lab results, imaging with the patient/caregiver today.   Review of Systems  Constitutional: Negative.   HENT: Negative.    Eyes: Negative.   Respiratory: Negative.    Cardiovascular: Negative.   Gastrointestinal: Negative.   Endocrine: Negative.   Genitourinary: Negative.    Musculoskeletal: Negative.   Skin: Negative.   Allergic/Immunologic: Negative.   Neurological: Negative.   Hematological: Negative.   Psychiatric/Behavioral: Negative.    All other systems reviewed and are negative.    Objective:   Vitals:   06/04/23 1050  BP: (!) 146/84  Pulse: 79  Resp: 16  Temp: 97.8 F (36.6 C)  TempSrc: Oral  SpO2: 99%  Weight: 166 lb 6.4 oz (75.5 kg)  Height: 5\' 9"  (1.753 m)    Body mass index is 24.57 kg/m.  Physical Exam Vitals and nursing note reviewed.  Constitutional:      General: He is not in acute distress.    Appearance: Normal appearance. He  is well-developed and normal weight. He is not ill-appearing, toxic-appearing or diaphoretic.     Interventions: Face mask in place.  HENT:     Head: Normocephalic and atraumatic.     Jaw: No trismus.     Right Ear: External ear normal.     Left Ear: External ear normal.  Eyes:     General: Lids are normal. No scleral icterus.       Right eye: No discharge.        Left eye: No discharge.     Conjunctiva/sclera: Conjunctivae normal.  Neck:     Trachea: Trachea and phonation normal. No tracheal deviation.  Cardiovascular:     Rate and Rhythm: Normal rate and regular rhythm.     Pulses: Normal pulses.          Radial pulses are 2+ on the right side and 2+ on the left side.       Posterior tibial pulses are 2+ on the right side and 2+ on the left side.     Heart sounds: Normal heart sounds. No murmur heard.    No friction rub. No gallop.  Pulmonary:     Effort: Pulmonary effort is normal. No respiratory distress.     Breath sounds: No stridor. No wheezing, rhonchi or rales.  Abdominal:     General: Bowel sounds are normal. There is no distension.     Palpations: Abdomen is soft.  Musculoskeletal:     Right lower leg: No edema.     Left lower leg: No edema.  Skin:    General: Skin is warm and dry.     Coloration: Skin is not jaundiced.     Findings: No rash.     Nails: There is no  clubbing.  Neurological:     Mental Status: He is alert. Mental status is at baseline.     Cranial Nerves: No dysarthria or facial asymmetry.     Motor: No tremor or abnormal muscle tone.     Gait: Gait normal.  Psychiatric:        Mood and Affect: Mood normal.        Speech: Speech normal.        Behavior: Behavior normal. Behavior is cooperative.         Assessment & Plan:   Problem List Items Addressed This Visit       Cardiovascular and Mediastinum   Essential hypertension    Not optimally controlled today on lisinopril 40 and amlodipine 5 BP Readings from Last 3 Encounters:  06/04/23 (!) 140/82  05/01/23 130/80  03/16/23 122/80  He has no concerning sx Will recheck BP in the next month - would recommend increasing norvasc to 10 if still elevated       Relevant Orders   COMPLETE METABOLIC PANEL WITH GFR   COMPLETE METABOLIC PANEL WITH GFR     Digestive   Gastroesophageal reflux disease without esophagitis    Symptoms daily with pepcid BID, food triggers and alcohol - avoid food triggers, Trial PPI x 2 weeks      Relevant Medications   pantoprazole (PROTONIX) 40 MG tablet     Other   Alcohol abuse    Discussed moderate alcohol use/consumption and encouraged pt to decrease use from 3-4 beers daily and 6+ on weekends to 1-2 beers less than 4 days a week to avoid risks of heavy ETOH use - explained to pt risk including but not limited to CVD, nutritional deficiencies, gerd/gastritis, liver, GI  cancers, abuse/dependence       Relevant Orders   COMPLETE METABOLIC PANEL WITH GFR   Lipid panel   Erectile dysfunction    Has meds but not using often, no SE or concerns with use or dosing      Relevant Orders   Ambulatory Referral Lung Cancer Screening [facility]   Current smoker    He is still trying to cut back - roughly 1ppd Smoking cessation resources and counseling offered today      Relevant Orders   Ambulatory Referral Lung Cancer Screening  [facility]   Smokes less than 1 pack a day with greater than 30 pack year history    Pt agrees to consult with pulm/lung cancer screening team      Relevant Orders   Ambulatory Referral Lung Cancer Screening [facility]   Hyperlipidemia - Primary    Lipids and ASCVD risk score elevated- he agree to start statin and would be due for recheck of labs today, he is not sure if he has been taking it, or just started it He will let us know once he checks his meds at home/in car Would recommend checking labs 3-4 months after regular use of statin for needed recheck       Relevant Orders   COMPLETE METABOLIC PANEL WITH GFR   Lipid panel   COMPLETE METABOLIC PANEL WITH GFR   Lipid panel   Other Visit Diagnoses     Prediabetes       discussed diet/lifestyle efforts that may improve A1C, recheck at next routine f/up appt, he does not need a 3 month recheck today   Relevant Orders   COMPLETE METABOLIC PANEL WITH GFR   Encounter for medication monitoring       Relevant Orders   COMPLETE METABOLIC PANEL WITH GFR   Lipid panel   Smoking greater than 30 pack years       Relevant Orders   Ambulatory Referral Lung Cancer Screening  Pulmonary       He will return for labs and BP recheck in the next month, BP goal <130/80  Return in about 6 months (around 12/04/2023) for Annual Physical.   Danelle Berry, PA-C 06/04/23 11:07 AM

## 2023-06-04 ENCOUNTER — Encounter: Payer: Self-pay | Admitting: Family Medicine

## 2023-06-04 ENCOUNTER — Ambulatory Visit: Payer: BC Managed Care – PPO | Admitting: Family Medicine

## 2023-06-04 VITALS — BP 140/82 | HR 79 | Temp 97.8°F | Resp 16 | Ht 69.0 in | Wt 166.4 lb

## 2023-06-04 DIAGNOSIS — I10 Essential (primary) hypertension: Secondary | ICD-10-CM

## 2023-06-04 DIAGNOSIS — F17210 Nicotine dependence, cigarettes, uncomplicated: Secondary | ICD-10-CM

## 2023-06-04 DIAGNOSIS — Z5181 Encounter for therapeutic drug level monitoring: Secondary | ICD-10-CM

## 2023-06-04 DIAGNOSIS — F17200 Nicotine dependence, unspecified, uncomplicated: Secondary | ICD-10-CM | POA: Diagnosis not present

## 2023-06-04 DIAGNOSIS — K219 Gastro-esophageal reflux disease without esophagitis: Secondary | ICD-10-CM | POA: Diagnosis not present

## 2023-06-04 DIAGNOSIS — E785 Hyperlipidemia, unspecified: Secondary | ICD-10-CM

## 2023-06-04 DIAGNOSIS — R7303 Prediabetes: Secondary | ICD-10-CM

## 2023-06-04 DIAGNOSIS — N529 Male erectile dysfunction, unspecified: Secondary | ICD-10-CM

## 2023-06-04 DIAGNOSIS — F1010 Alcohol abuse, uncomplicated: Secondary | ICD-10-CM

## 2023-06-04 MED ORDER — PANTOPRAZOLE SODIUM 40 MG PO TBEC
40.0000 mg | DELAYED_RELEASE_TABLET | Freq: Every day | ORAL | 1 refills | Status: DC | PRN
Start: 2023-06-04 — End: 2023-11-03

## 2023-06-04 NOTE — Patient Instructions (Addendum)
Protonix for acid reflux symptoms You can try to look up the medication on GoodRx.com for coupons if your insurance price is too high Also you can try over the counter prilosec, nexium or prevacid   Managing Your Hypertension Hypertension, also called high blood pressure, is when the force of the blood pressing against the walls of the arteries is too strong. Arteries are blood vessels that carry blood from your heart throughout your body. Hypertension forces the heart to work harder to pump blood and may cause the arteries to become narrow or stiff. Understanding blood pressure readings A blood pressure reading includes a higher number over a lower number: The first, or top, number is called the systolic pressure. It is a measure of the pressure in your arteries as your heart beats. The second, or bottom number, is called the diastolic pressure. It is a measure of the pressure in your arteries as the heart relaxes. For most people, a normal blood pressure is below 120/80. Your personal target blood pressure may vary depending on your medical conditions, your age, and other factors. Blood pressure is classified into four stages. Based on your blood pressure reading, your health care provider may use the following stages to determine what type of treatment you need, if any. Systolic pressure and diastolic pressure are measured in a unit called millimeters of mercury (mmHg). Normal Systolic pressure: below 120. Diastolic pressure: below 80. Elevated Systolic pressure: 120-129. Diastolic pressure: below 80. Hypertension stage 1 Systolic pressure: 130-139. Diastolic pressure: 80-89. Hypertension stage 2 Systolic pressure: 140 or above. Diastolic pressure: 90 or above. How can this condition affect me? Managing your hypertension is very important. Over time, hypertension can damage the arteries and decrease blood flow to parts of the body, including the brain, heart, and kidneys. Having untreated  or uncontrolled hypertension can lead to: A heart attack. A stroke. A weakened blood vessel (aneurysm). Heart failure. Kidney damage. Eye damage. Memory and concentration problems. Vascular dementia. What actions can I take to manage this condition? Hypertension can be managed by making lifestyle changes and possibly by taking medicines. Your health care provider will help you make a plan to bring your blood pressure within a normal range. You may be referred for counseling on a healthy diet and physical activity. Nutrition  Eat a diet that is high in fiber and potassium, and low in salt (sodium), added sugar, and fat. An example eating plan is called the DASH diet. DASH stands for Dietary Approaches to Stop Hypertension. To eat this way: Eat plenty of fresh fruits and vegetables. Try to fill one-half of your plate at each meal with fruits and vegetables. Eat whole grains, such as whole-wheat pasta, brown rice, or whole-grain bread. Fill about one-fourth of your plate with whole grains. Eat low-fat dairy products. Avoid fatty cuts of meat, processed or cured meats, and poultry with skin. Fill about one-fourth of your plate with lean proteins such as fish, chicken without skin, beans, eggs, and tofu. Avoid pre-made and processed foods. These tend to be higher in sodium, added sugar, and fat. Reduce your daily sodium intake. Many people with hypertension should eat less than 1,500 mg of sodium a day. Lifestyle  Work with your health care provider to maintain a healthy body weight or to lose weight. Ask what an ideal weight is for you. Get at least 30 minutes of exercise that causes your heart to beat faster (aerobic exercise) most days of the week. Activities may include walking, swimming, or  biking. Include exercise to strengthen your muscles (resistance exercise), such as weight lifting, as part of your weekly exercise routine. Try to do these types of exercises for 30 minutes at least 3 days  a week. Do not use any products that contain nicotine or tobacco. These products include cigarettes, chewing tobacco, and vaping devices, such as e-cigarettes. If you need help quitting, ask your health care provider. Control any long-term (chronic) conditions you have, such as high cholesterol or diabetes. Identify your sources of stress and find ways to manage stress. This may include meditation, deep breathing, or making time for fun activities. Alcohol use Do not drink alcohol if: Your health care provider tells you not to drink. You are pregnant, may be pregnant, or are planning to become pregnant. If you drink alcohol: Limit how much you have to: 0-1 drink a day for women. 0-2 drinks a day for men. Know how much alcohol is in your drink. In the [place] one drink equals one 12 oz bottle of beer (355 mL), one 5 oz glass of wine (148 mL), or one 1 oz glass of hard liquor (44 mL). Medicines Your health care provider may prescribe medicine if lifestyle changes are not enough to get your blood pressure under control and if: Your systolic blood pressure is 130 or higher. Your diastolic blood pressure is 80 or higher. Take medicines only as told by your health care provider. Follow the directions carefully. Blood pressure medicines must be taken as told by your health care provider. The medicine does not work as well when you skip doses. Skipping doses also puts you at risk for problems. Monitoring Before you monitor your blood pressure: Do not smoke, drink caffeinated beverages, or exercise within 30 minutes before taking a measurement. Use the bathroom and empty your bladder (urinate). Sit quietly for at least 5 minutes before taking measurements. Monitor your blood pressure at home as told by your health care provider. To do this: Sit with your back straight and supported. Place your feet flat on the floor. Do not cross your legs. Support your arm on a flat surface, such as a table. Make  sure your upper arm is at heart level. Each time you measure, take two or three readings one minute apart and record the results. You may also need to have your blood pressure checked regularly by your health care provider. General information Talk with your health care provider about your diet, exercise habits, and other lifestyle factors that may be contributing to hypertension. Review all the medicines you take with your health care provider because there may be side effects or interactions. Keep all follow-up visits. Your health care provider can help you create and adjust your plan for managing your high blood pressure. Where to find more information National Heart, Lung, and Blood Institute: PopSteam.is American Heart Association: www.heart.org Contact a health care provider if: You think you are having a reaction to medicines you have taken. You have repeated (recurrent) headaches. You feel dizzy. You have swelling in your ankles. You have trouble with your vision. Get help right away if: You develop a severe headache or confusion. You have unusual weakness or numbness, or you feel faint. You have severe pain in your chest or abdomen. You vomit repeatedly. You have trouble breathing. These symptoms may be an emergency. Get help right away. Call 911. Do not wait to see if the symptoms will go away. Do not drive yourself to the hospital. Summary Hypertension is when the  force of blood pumping through your arteries is too strong. If this condition is not controlled, it may put you at risk for serious complications. Your personal target blood pressure may vary depending on your medical conditions, your age, and other factors. For most people, a normal blood pressure is less than 120/80. Hypertension is managed by lifestyle changes, medicines, or both. Lifestyle changes to help manage hypertension include losing weight, eating a healthy, low-sodium diet, exercising more, stopping  smoking, and limiting alcohol. This information is not intended to replace advice given to you by your health care provider. Make sure you discuss any questions you have with your health care provider. Document Revised: 08/15/2021 Document Reviewed: 08/15/2021 Elsevier Patient Education  2024 [employer].  Alcohol use: Weighing risks and benefits - info from [facility] Drinking alcohol is a health risk regardless of the amount.  By [facility] Staff Research on alcohol suggests a sobering conclusion: Drinking alcohol in any amount carries a health risk. While the risk is low for moderate intake, the risk goes up as the amount you drink goes up.  Many people drink alcohol as a personal preference, during social activities, or as a part of cultural and religious practices. People who choose not to drink make that choice for the same reasons. Knowing your personal risk based on your habits can help you make the best decision for you.  The evidence for moderate alcohol use in healthy adults is still being studied. But good evidence shows that drinking high amounts of alcohol are clearly linked to health problems.  Here's a closer look at alcohol and health.  Defining moderate alcohol use Moderate alcohol use may not mean the same thing in research studies or among health agencies.  In the [place], moderate drinking for healthy adults is different for men and women. It means on days when a person does drink, women do not have more than one drink and men do not have more than two drinks.  Examples of one drink include:  12 fluid ounces (355 milliliters) of regular beer 5 fluid ounces (148 milliliters) of wine 1.5 fluid ounces (44 milliliters) of hard liquor or distilled spirits Health agencies outside the [place] may define one drink differently.  The term "moderate" also may be used differently. For example, it may be used to define the risk of illness or injury based on the number of  drinks a person has in a week.  Risks of moderate alcohol use The bottom line is that alcohol is potentially addictive, can cause intoxication, and contributes to health problems and preventable deaths. If you already drink at low levels and continue to drink, risks for these issues appear to be low. But the risk is not zero.  For example, any amount of drinking increases the risk of breast cancer and colorectal cancer. As consumption goes up, the risk goes up for these cancers. It is a tiny, but real, increased risk.  Drinking also adds calories that can contribute to weight gain. And drinking raises the risk of problems in the digestive system.  In the past, moderate drinking was thought to be linked with a lower risk of dying from heart disease and possibly diabetes. After more analysis of the research, that doesn't seem to be the case. In general, a healthy diet and physical activity have much greater health benefits than alcohol and have been more extensively studied.  Risks of heavy alcohol use Heavy drinking, including binge drinking, is a high-risk activity.  The definition of heavy drinking is based on a person's sex. For women, more than three drinks on any day or more than seven drinks a week is heavy drinking. For men, heavy drinking means more than four drinks on any day or more than 14 drinks a week.

## 2023-06-04 NOTE — Assessment & Plan Note (Addendum)
Symptoms daily with pepcid BID, food triggers and alcohol - avoid food triggers, Trial PPI x 2 weeks

## 2023-06-09 NOTE — Assessment & Plan Note (Signed)
Discussed moderate alcohol use/consumption and encouraged pt to decrease use from 3-4 beers daily and 6+ on weekends to 1-2 beers less than 4 days a week to avoid risks of heavy ETOH use - explained to pt risk including but not limited to CVD, nutritional deficiencies, gerd/gastritis, liver, GI cancers, abuse/dependence

## 2023-06-09 NOTE — Assessment & Plan Note (Signed)
Has meds but not using often, no SE or concerns with use or dosing

## 2023-06-09 NOTE — Assessment & Plan Note (Signed)
Pt agrees to consult with pulm/lung cancer screening team

## 2023-06-09 NOTE — Assessment & Plan Note (Signed)
He is still trying to cut back - roughly 1ppd Smoking cessation resources and counseling offered today

## 2023-06-09 NOTE — Assessment & Plan Note (Signed)
Lipids and ASCVD risk score elevated- he agree to start statin and would be due for recheck of labs today, he is not sure if he has been taking it, or just started it He will let us know once he checks his meds at home/in car Would recommend checking labs 3-4 months after regular use of statin for needed recheck

## 2023-06-09 NOTE — Assessment & Plan Note (Addendum)
Not optimally controlled today on lisinopril 40 and amlodipine 5 BP Readings from Last 3 Encounters:  06/04/23 (!) 140/82  05/01/23 130/80  03/16/23 122/80  He has no concerning sx Will recheck BP in the next month - would recommend increasing norvasc to 10 if still elevated

## 2023-06-15 ENCOUNTER — Ambulatory Visit: Payer: BC Managed Care – PPO | Admitting: Family Medicine

## 2023-06-22 NOTE — Progress Notes (Unsigned)
   Acute Office Visit  Subjective:     Patient ID: Fredrick Guinane, male    DOB: Feb 15, 1971, 52 y.o.   MRN: [id number]  No chief complaint on file.   HPI Patient is in today for right shoulder pain, concerns for pinched nerve.  ROS      Objective:    There were no vitals taken for this visit. {Vitals History (Optional):23777}  Physical Exam  No results found for any visits on 06/23/23.      Assessment & Plan:   Problem List Items Addressed This Visit   None   No orders of the defined types were placed in this encounter.   No follow-ups on file.  Margarita Mail, DO

## 2023-06-23 ENCOUNTER — Ambulatory Visit: Payer: BC Managed Care – PPO | Admitting: Internal Medicine

## 2023-06-23 ENCOUNTER — Encounter: Payer: Self-pay | Admitting: Internal Medicine

## 2023-06-23 VITALS — BP 122/82 | HR 75 | Temp 97.6°F | Resp 16 | Ht 69.0 in | Wt 164.5 lb

## 2023-06-23 DIAGNOSIS — R202 Paresthesia of skin: Secondary | ICD-10-CM | POA: Diagnosis not present

## 2023-06-23 DIAGNOSIS — R7303 Prediabetes: Secondary | ICD-10-CM | POA: Diagnosis not present

## 2023-06-23 DIAGNOSIS — E519 Thiamine deficiency, unspecified: Secondary | ICD-10-CM

## 2023-06-23 DIAGNOSIS — R200 Anesthesia of skin: Secondary | ICD-10-CM | POA: Diagnosis not present

## 2023-06-23 MED ORDER — TIZANIDINE HCL 4 MG PO TABS
4.0000 mg | ORAL_TABLET | Freq: Every evening | ORAL | 0 refills | Status: DC | PRN
Start: 2023-06-23 — End: 2023-07-21

## 2023-06-23 MED ORDER — NAPROXEN 500 MG PO TABS
500.0000 mg | ORAL_TABLET | Freq: Two times a day (BID) | ORAL | 0 refills | Status: AC
Start: 2023-06-23 — End: 2023-07-03

## 2023-06-26 LAB — HEMOGLOBIN A1C
Hgb A1c MFr Bld: 5.7 % of total Hgb — ABNORMAL HIGH (ref <5.7)
Mean Plasma Glucose: 117 mg/dL
eAG (mmol/L): 6.5 mmol/L

## 2023-06-26 LAB — VITAMIN B1: Vitamin B1 (Thiamine): <6 nmol/L — ABNORMAL LOW (ref 8–30)

## 2023-06-26 LAB — VITAMIN B12: Vitamin B-12: 386 pg/mL (ref 200–1100)

## 2023-06-29 MED ORDER — THIAMINE MONONITRATE 100 MG PO TABS
100.0000 mg | ORAL_TABLET | Freq: Every day | ORAL | 1 refills | Status: AC
Start: 2023-06-29 — End: ?

## 2023-06-29 NOTE — Addendum Note (Signed)
Addended by: Margarita Mail on: 06/29/2023 08:03 AM   Modules accepted: Orders

## 2023-07-03 ENCOUNTER — Ambulatory Visit: Payer: BC Managed Care – PPO

## 2023-07-20 ENCOUNTER — Other Ambulatory Visit: Payer: Self-pay | Admitting: Internal Medicine

## 2023-07-20 DIAGNOSIS — R200 Anesthesia of skin: Secondary | ICD-10-CM

## 2023-07-21 ENCOUNTER — Ambulatory Visit: Payer: BC Managed Care – PPO | Admitting: Family Medicine

## 2023-07-21 NOTE — Telephone Encounter (Signed)
Requested medication (s) are due for refill today: Yes  Requested medication (s) are on the active medication list: Yes  Last refill:  06/23/23 #30 0RF  Future visit scheduled: Yes  Notes to clinic:  Unable to refill per protocol, cannot delegate.      Requested Prescriptions  Pending Prescriptions Disp Refills   tiZANidine (ZANAFLEX) 4 MG tablet [Pharmacy Med Name: TIZANIDINE 4MG  TABLETS] 30 tablet 0    Sig: TAKE 1 TABLET(4 MG) BY MOUTH AT BEDTIME AS NEEDED FOR MUSCLE SPASMS     Not Delegated - Cardiovascular:  Alpha-2 Agonists - tizanidine Failed - 07/20/2023 10:09 AM      Failed - This refill cannot be delegated      Passed - Valid encounter within last 6 months    Recent Outpatient Visits           4 weeks ago Numbness and tingling of right arm   [facility] Margarita Mail, DO   1 month ago Hyperlipidemia, unspecified hyperlipidemia type   [facility] Danelle Berry, PA-C   2 months ago Abscess   [facility] Della Goo F, FNP   4 months ago Hyperlipidemia, unspecified hyperlipidemia type   [facility] Mecum, Oswaldo Conroy, PA-C   4 months ago Essential hypertension   [facility] Mecum, Oswaldo Conroy, PA-C       Future Appointments             In 4 months Danelle Berry, PA-C [facility], [facility]

## 2023-07-30 ENCOUNTER — Other Ambulatory Visit: Payer: Self-pay

## 2023-07-30 DIAGNOSIS — Z122 Encounter for screening for malignant neoplasm of respiratory organs: Secondary | ICD-10-CM

## 2023-07-30 DIAGNOSIS — Z87891 Personal history of nicotine dependence: Secondary | ICD-10-CM

## 2023-07-30 DIAGNOSIS — F17210 Nicotine dependence, cigarettes, uncomplicated: Secondary | ICD-10-CM

## 2023-08-14 IMAGING — MR MR SHOULDER*L* W/O CM
4 of 6 series · 22 of 40 positions shown · non-contrast
Comparison: None.

CLINICAL DATA: Left shoulder pain with arm weakness since July 2021.

EXAM:
MRI OF THE LEFT SHOULDER WITHOUT CONTRAST
TECHNIQUE: Multiplanar, multisequence MR imaging of the shoulder was performed.
No intravenous contrast was administered.

[Series 6: T2 fat-sat · axial · left · 3.0mm · 0.50mm/px · z∈[-35,+78]mm · 8 of 30 slices shown (1 of 3)]
[im 1/30]
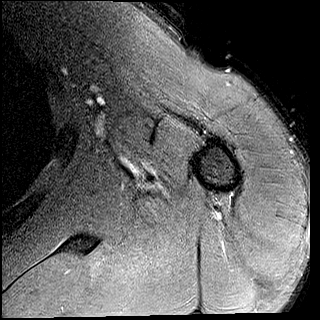
[im 5/30]
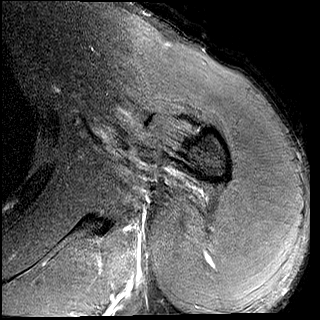
[im 9/30]
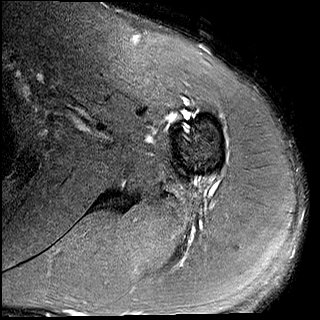
[im 13/30]
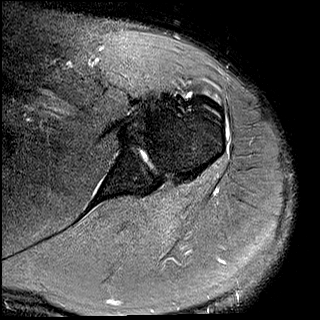
[im 17/30]
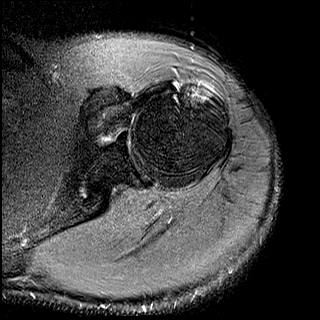
[im 21/30]
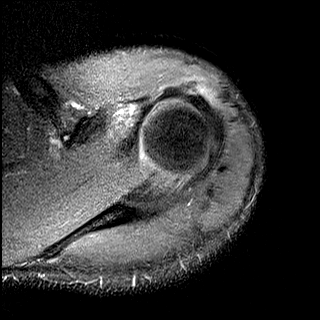
[im 25/30]
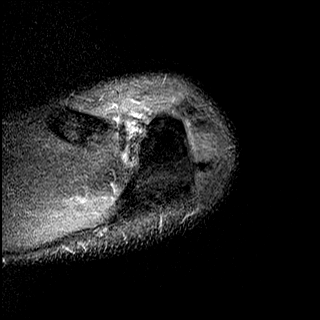
[im 30/30]
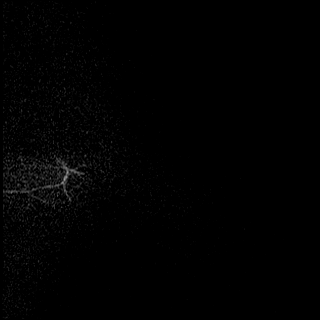

[Series 7: T2 fat-sat · oblique · left · 4.0mm · 0.23mm/px · 5 of 22 slices shown (2 of 3)]
[im 1/22]
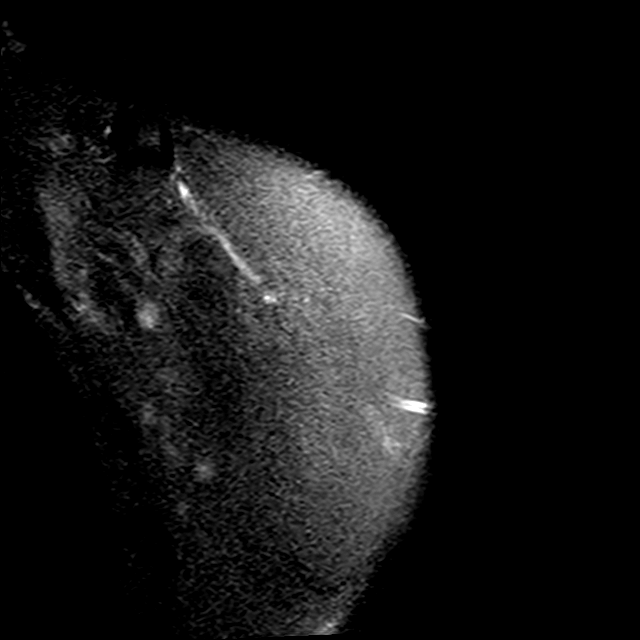
[im 5/22]
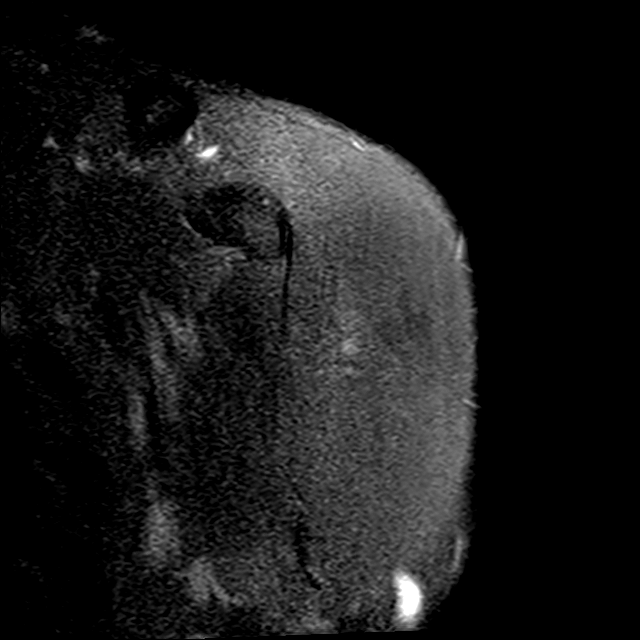
[im 9/22]
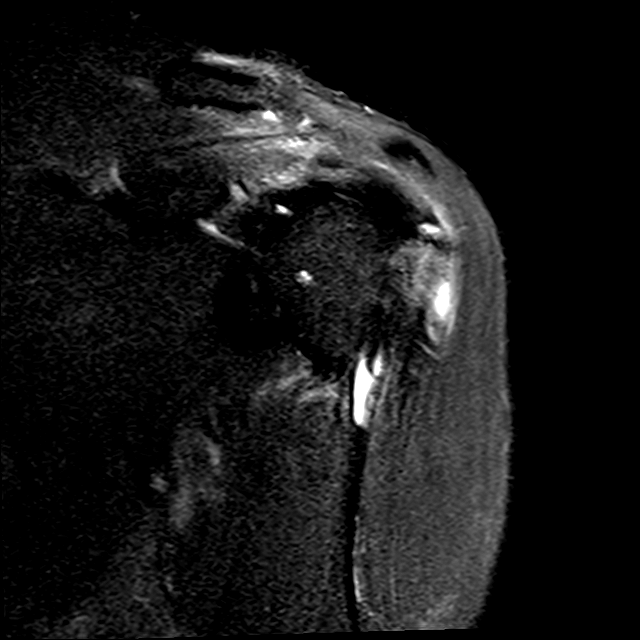
[im 13/22]
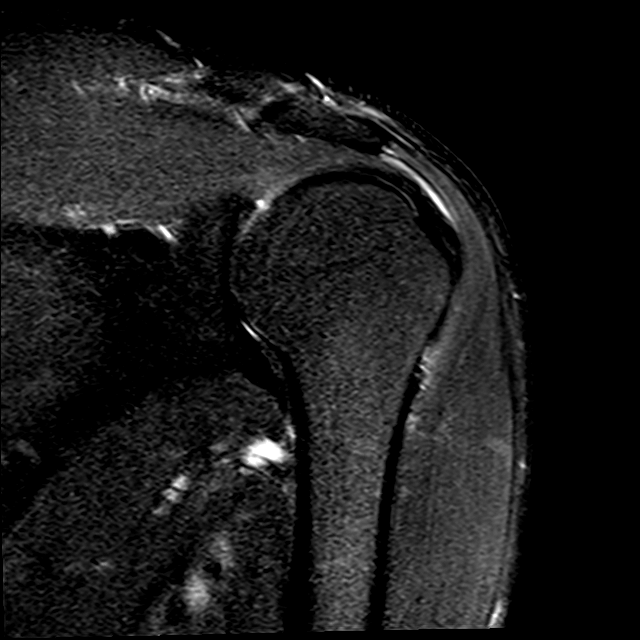
[im 22/22]
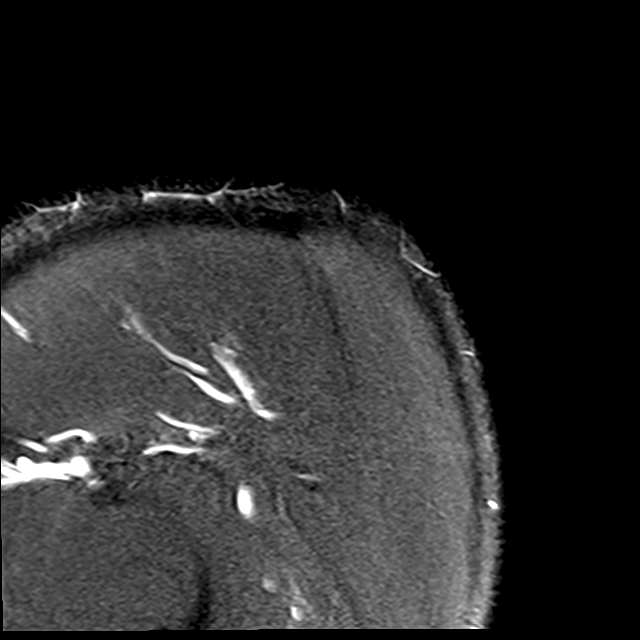

[Series 8: PD · oblique · left · 4.0mm · 0.29mm/px · 6 of 21 slices shown]
[im 1/21]
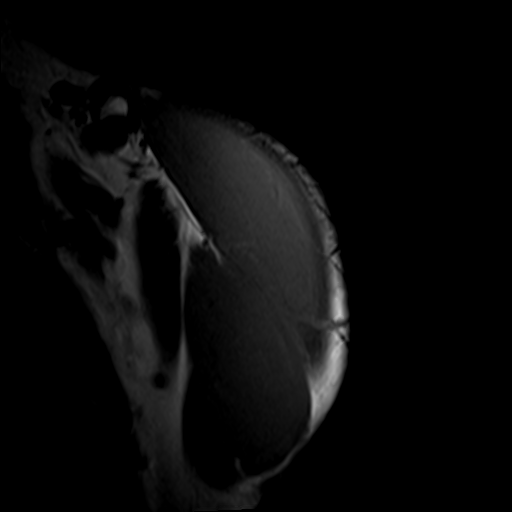
[im 5/21]
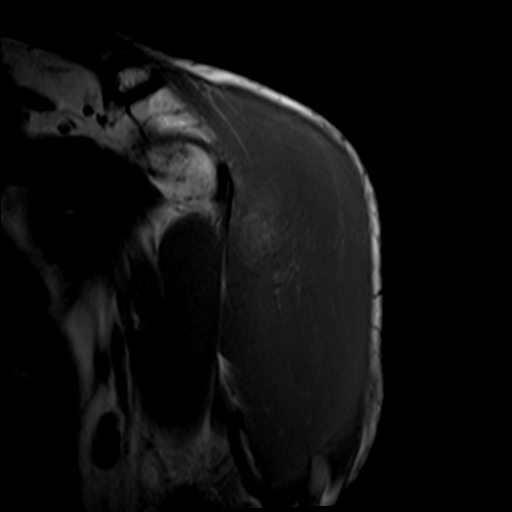
[im 9/21]
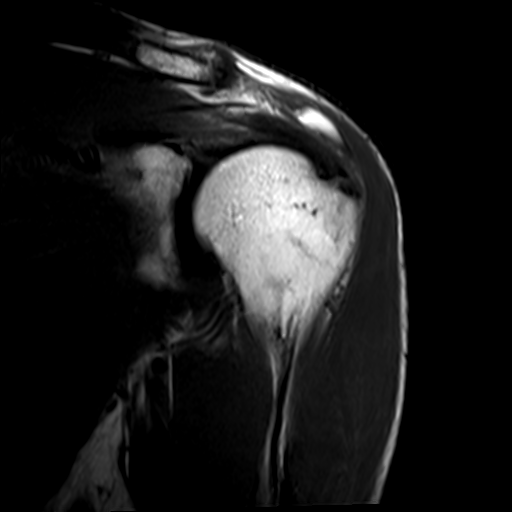
[im 13/21]
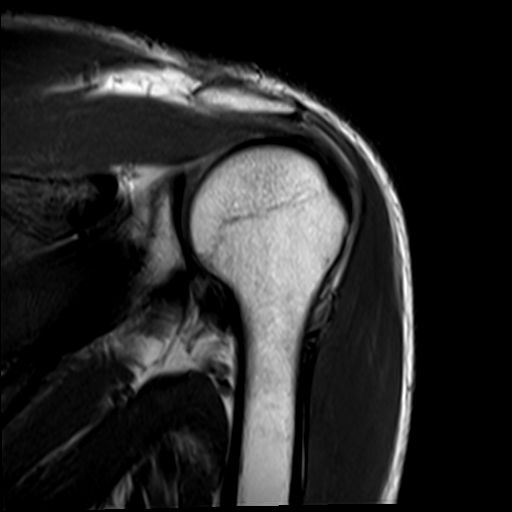
[im 17/21]
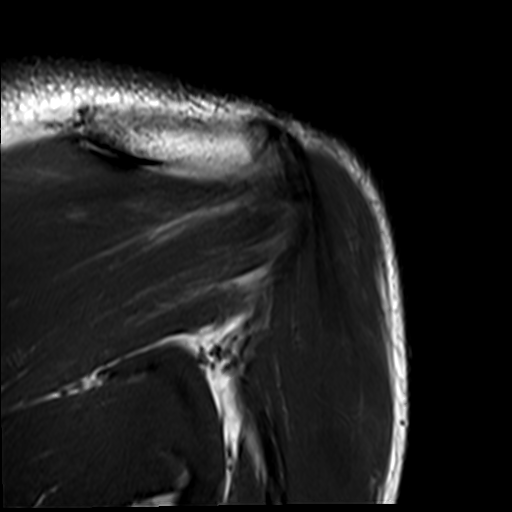
[im 21/21]
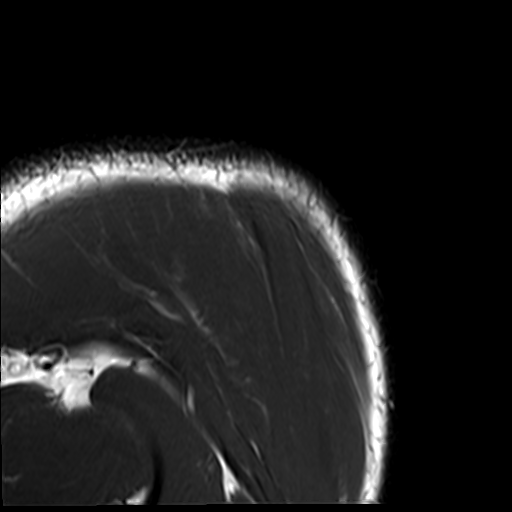

[Series 9: T2 fat-sat · coronal · left · 4.0mm · 0.47mm/px · 3 of 23 slices shown (3 of 3)]
[im 5/23]
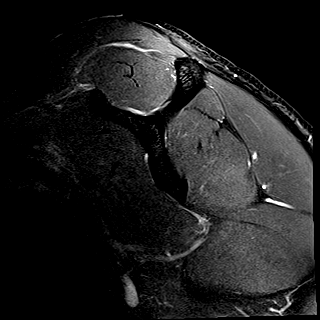
[im 14/23]
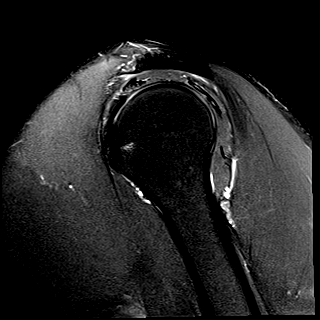
[im 23/23]
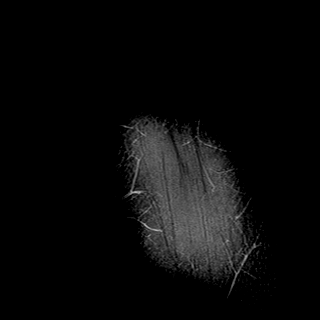

[22 of 40 positions shown; findings below may reference images not displayed]

FINDINGS: Rotator cuff: Minimal rotator cuff tendinopathy/tendinosis. No
partial or full-thickness tear.

Muscles: Unremarkable except for a small intramuscular lipoma in the
anterior deltoid near its attachment on the acromion.

Biceps long head:  Intact

Acromioclavicular Joint: Mild degenerative changes. Type 2 acromion.
No significant lateral downsloping or subacromial spurring.

Glenohumeral Joint: The articular cartilage is intact. No joint
effusion. Mild thickening of the capsular structures in the axillary
recess can be seen with synovitis or adhesive capsulitis.

Labrum:  No labral tears.

Bones: Mild edema like signal changes in the anterior aspect of the
greater tuberosity could reflect a bone contusion or stress
reaction.

Other: Mild subacromial/subdeltoid bursitis.
IMPRESSION: 1. Minimal rotator cuff tendinopathy/tendinosis. No partial or
full-thickness tear.
2. Intact long head biceps tendon and glenoid labrum.
3. Mild thickening of the capsular structures in the axillary recess
can be seen with synovitis or adhesive capsulitis.
4. Mild subacromial/subdeltoid bursitis.
5. Mild edema like signal changes in the anterior aspect of the
greater tuberosity could reflect a bone contusion or stress
reaction.

## 2023-08-23 ENCOUNTER — Other Ambulatory Visit: Payer: Self-pay | Admitting: Family Medicine

## 2023-08-23 DIAGNOSIS — I10 Essential (primary) hypertension: Secondary | ICD-10-CM

## 2023-09-08 ENCOUNTER — Ambulatory Visit: Payer: Self-pay

## 2023-10-01 ENCOUNTER — Other Ambulatory Visit: Payer: Self-pay | Admitting: Physician Assistant

## 2023-10-01 DIAGNOSIS — E785 Hyperlipidemia, unspecified: Secondary | ICD-10-CM

## 2023-10-01 NOTE — Telephone Encounter (Signed)
Requested Prescriptions  Pending Prescriptions Disp Refills   atorvastatin (LIPITOR) 10 MG tablet [Pharmacy Med Name: ATORVASTATIN 10MG  TABLETS] 90 tablet 0    Sig: TAKE 1 TABLET(10 MG) BY MOUTH DAILY     Cardiovascular:  Antilipid - Statins Failed - 10/01/2023 10:13 AM      Failed - Lipid Panel in normal range within the last 12 months    Cholesterol  Date Value Ref Range Status  03/06/2023 216 (H) <200 mg/dL Final   LDL Cholesterol (Calc)  Date Value Ref Range Status  03/06/2023 122 (H) mg/dL (calc) Final    Comment:    Reference range: <100 . Desirable range <100 mg/dL for primary prevention;   <70 mg/dL for patients with CHD or diabetic patients  with > or = 2 CHD risk factors. [name] Kitchen LDL-C is now calculated using the Martin-Hopkins  calculation, which is a validated novel method providing  better accuracy than the Friedewald equation in the  estimation of LDL-C.  Horald Pollen et al. Lenox Ahr. 0258;527(78): 2061-2068  (http://education.[employer].com/faq/FAQ164)    HDL  Date Value Ref Range Status  03/06/2023 75 > OR = 40 mg/dL Final   Triglycerides  Date Value Ref Range Status  03/06/2023 87 <150 mg/dL Final         Passed - Patient is not pregnant      Passed - Valid encounter within last 12 months    Recent Outpatient Visits           3 months ago Numbness and tingling of right arm   [facility] Margarita Mail, DO   3 months ago Hyperlipidemia, unspecified hyperlipidemia type   [facility] Danelle Berry, PA-C   5 months ago Abscess   [facility] Della Goo F, FNP   6 months ago Hyperlipidemia, unspecified hyperlipidemia type   [facility] Mecum, Oswaldo Conroy, PA-C   7 months ago Essential hypertension   [facility] Mecum, Oswaldo Conroy, PA-C       Future Appointments             In 2 months Danelle Berry, PA-C [facility], [facility]

## 2023-11-03 ENCOUNTER — Ambulatory Visit: Payer: BC Managed Care – PPO | Admitting: Physician Assistant

## 2023-11-03 ENCOUNTER — Other Ambulatory Visit: Payer: Self-pay | Admitting: Family Medicine

## 2023-11-03 ENCOUNTER — Encounter: Payer: Self-pay | Admitting: Physician Assistant

## 2023-11-03 VITALS — BP 160/103 | HR 64 | Resp 16 | Ht 69.0 in | Wt 159.3 lb

## 2023-11-03 DIAGNOSIS — I10 Essential (primary) hypertension: Secondary | ICD-10-CM | POA: Diagnosis not present

## 2023-11-03 DIAGNOSIS — Z7689 Persons encountering health services in other specified circumstances: Secondary | ICD-10-CM

## 2023-11-03 DIAGNOSIS — K219 Gastro-esophageal reflux disease without esophagitis: Secondary | ICD-10-CM

## 2023-11-03 DIAGNOSIS — F17200 Nicotine dependence, unspecified, uncomplicated: Secondary | ICD-10-CM | POA: Insufficient documentation

## 2023-11-03 DIAGNOSIS — E785 Hyperlipidemia, unspecified: Secondary | ICD-10-CM

## 2023-11-03 DIAGNOSIS — IMO0001 Reserved for inherently not codable concepts without codable children: Secondary | ICD-10-CM | POA: Insufficient documentation

## 2023-11-03 DIAGNOSIS — R7303 Prediabetes: Secondary | ICD-10-CM

## 2023-11-03 MED ORDER — PANTOPRAZOLE SODIUM 40 MG PO TBEC: 40.0000 mg | DELAYED_RELEASE_TABLET | Freq: Every day | ORAL | 0 refills | Status: DC

## 2023-11-03 NOTE — Progress Notes (Signed)
New patient visit  Patient: Calvin Taylor   DOB: October 23, 1971   52 y.o. Male  MRN: [id number] Visit Date: 11/03/2023  Today's healthcare provider: Debera Lat, PA-C   Chief Complaint  Patient presents with   Cough    Patient reports that cough is only at night with some belching.   Subjective    Calvin Taylor is a 52 y.o. male who presents today as a new patient to establish care.   Discussed the use of AI scribe software for clinical note transcription with the patient, who gave verbal consent to proceed.  History of Present Illness   The patient, with a history of high blood pressure, cholesterol, and acid reflux, presents with a cough that has been ongoing for three weeks. The cough, which is worse at night, is associated with a burning sensation in the nose. The patient also reports a sensation of something in his throat before he coughs. He has been self-medicating with over-the-counter cough medicine and Mucozax. The patient also reports late-night eating habits, often eating just before going to bed. He has a history of smoking for over 40 years.        Past Medical History:  Diagnosis Date   Alcohol abuse    Allergic conjunctivitis    ED (erectile dysfunction)    GERD (gastroesophageal reflux disease)    Hyperlipidemia    Hypertension    Pre-diabetes    Sebaceous cyst    Past Surgical History:  Procedure Laterality Date   COLONOSCOPY WITH PROPOFOL N/A 04/29/2021   Procedure: COLONOSCOPY WITH PROPOFOL;  Surgeon: Toney Reil, MD;  Location: [facility] ENDOSCOPY;  Service: Gastroenterology;  Laterality: N/A;   right eye surgery     Family Status  Relation Name Status   Mother  (Not Specified)   Father  (Not Specified)  No partnership data on file   Family History  Problem Relation Age of Onset   Hypertension Mother    Hypertension Father    Social History   Socioeconomic History   Marital status: Single    Spouse name: Not on file   Number of children: 1    Years of education: Not on file   Highest education level: Not on file  Occupational History   Not on file  Tobacco Use   Smoking status: Every Day    Current packs/day: 1.00    Average packs/day: 1 pack/day for 33.0 years (33.0 ttl pk-yrs)    Types: Cigarettes   Smokeless tobacco: Never   Tobacco comments:    Smoked 2ppd for 26 years, started smoking 52 y/o, 56 pack year hx  Vaping Use   Vaping status: Never Used  Substance and Sexual Activity   Alcohol use: Yes    Alcohol/week: 32.0 standard drinks of alcohol    Types: 30 Cans of beer, 2 Shots of liquor per week    Comment: per day beers 3 weekdays 6 weekends   Drug use: No   Sexual activity: Yes    Partners: Female  Other Topics Concern   Not on file  Social History Narrative   Works for a moving company   Social Determinants of Corporate investment banker Strain: Low Risk  (03/11/2019)   Overall Financial Resource Strain (CARDIA)    Difficulty of Paying Living Expenses: Not hard at all  Food Insecurity: No Food Insecurity (03/11/2019)   Hunger Vital Sign    Worried About Running Out of Food in the Last Year: Never true  Ran Out of Food in the Last Year: Never true  Transportation Needs: No Transportation Needs (03/11/2019)   PRAPARE - Administrator, Civil Service (Medical): No    Lack of Transportation (Non-Medical): No  Physical Activity: Sufficiently Active (07/26/2019)   Exercise Vital Sign    Days of Exercise per Week: 5 days    Minutes of Exercise per Session: 60 min  Stress: No Stress Concern Present (03/11/2019)   [employer] of Occupational Health - Occupational Stress Questionnaire    Feeling of Stress : Not at all  Social Connections: Moderately Isolated (03/11/2019)   Social Connection and Isolation Panel [NHANES]    Frequency of Communication with Friends and Family: Twice a week    Frequency of Social Gatherings with Friends and Family: Twice a week    Attends Religious Services:  Never    Database administrator or Organizations: No    Attends Engineer, structural: Never    Marital Status: Never married   Outpatient Medications Prior to Visit  Medication Sig   amLODipine (NORVASC) 5 MG tablet TAKE 1 TABLET(5 MG) BY MOUTH DAILY   atorvastatin (LIPITOR) 10 MG tablet TAKE 1 TABLET(10 MG) BY MOUTH DAILY   blood glucose meter kit and supplies KIT Dispense based on patient and insurance preference. Use up to four times daily as directed. (FOR ICD-10 E 16.2)   cromolyn (OPTICROM) 4 % ophthalmic solution Place 1 drop into both eyes 4 (four) times daily.   famotidine (PEPCID) 20 MG tablet Take 1 tablet (20 mg total) by mouth 2 (two) times daily as needed for heartburn or indigestion.   ketoconazole (NIZORAL) 2 % shampoo Apply 1 application topically 2 (two) times a week.   lisinopril (ZESTRIL) 40 MG tablet Take 1 tablet (40 mg total) by mouth daily.   tadalafil (CIALIS) 20 MG tablet TAKE 1 TABLET(20 MG) BY MOUTH EVERY OTHER DAY AS NEEDED FOR ERECTILE DYSFUNCTION   loratadine (CLARITIN) 10 MG tablet Take 1 tablet (10 mg total) by mouth daily.   thiamine (VITAMIN B1) 100 MG tablet Take 1 tablet (100 mg total) by mouth daily. (Patient not taking: Reported on 11/03/2023)   tiZANidine (ZANAFLEX) 4 MG tablet TAKE 1 TABLET(4 MG) BY MOUTH AT BEDTIME AS NEEDED FOR MUSCLE SPASMS (Patient not taking: Reported on 11/03/2023)   [DISCONTINUED] pantoprazole (PROTONIX) 40 MG tablet Take 1 tablet (40 mg total) by mouth daily as needed for up to 14 days (for reflux indigestion, use 14 d courses).   No facility-administered medications prior to visit.   No Known Allergies  Immunization History  Administered Date(s) Administered   PFIZER Comirnaty(Gray Top)Covid-19 Tri-Sucrose Vaccine 01/14/2021   Tdap 03/11/2019    Health Maintenance  Topic Date Due   Zoster Vaccines- Shingrix (1 of 2) Never done   COVID-19 Vaccine (2 - Pfizer risk series) 02/04/2021   INFLUENZA VACCINE   03/14/2024 (Originally 07/16/2023)   Lung Cancer Screening  06/03/2024 (Originally 04/22/2022)   Colonoscopy  04/29/2026   DTaP/Tdap/Td (2 - Td or Tdap) 03/10/2029   Hepatitis C Screening  Completed   HIV Screening  Completed   HPV VACCINES  Aged Out    Patient Care Team: Danelle Berry, PA-C as PCP - General (Family Medicine)  Review of Systems  All other systems reviewed and are negative.  Except see HPI       Objective    BP (!) 160/103 (BP Location: Left Arm, Patient Position: Sitting, Cuff Size: Normal)   Pulse  64   Resp 16   Ht 5\' 9"  (1.753 m)   Wt 159 lb 4.8 oz (72.3 kg)   SpO2 100%   BMI 23.52 kg/m     Physical Exam Vitals reviewed.  Constitutional:      General: He is not in acute distress.    Appearance: Normal appearance. He is not diaphoretic.  HENT:     Head: Normocephalic and atraumatic.  Eyes:     General: No scleral icterus.    Conjunctiva/sclera: Conjunctivae normal.  Cardiovascular:     Rate and Rhythm: Normal rate and regular rhythm.     Pulses: Normal pulses.     Heart sounds: Normal heart sounds. No murmur heard. Pulmonary:     Effort: Pulmonary effort is normal. No respiratory distress.     Breath sounds: Normal breath sounds. No wheezing or rhonchi.  Musculoskeletal:     Cervical back: Neck supple.     Right lower leg: No edema.     Left lower leg: No edema.  Lymphadenopathy:     Cervical: No cervical adenopathy.  Skin:    General: Skin is warm and dry.     Findings: No rash.  Neurological:     Mental Status: He is alert and oriented to person, place, and time. Mental status is at baseline.  Psychiatric:        Mood and Affect: Mood normal.        Behavior: Behavior normal.     Depression Screen    11/03/2023   10:08 AM 06/23/2023    8:38 AM 06/04/2023   10:50 AM 05/01/2023   10:20 AM  PHQ 2/9 Scores  PHQ - 2 Score 0 0 0 0  PHQ- 9 Score  0 0    No results found for any visits on 11/03/23.  Assessment & Plan          Gastroesophageal Reflux Disease (GERD) Chronic cough and heartburn symptoms, particularly after eating and lying down. Previous improvement with Pepcid 20mg  and Pantoprazole 40mg . -Resume Pepcid and Pantoprazole. -Avoid eating late at night and lying down immediately after meals. -Avoid acidic, spicy, and caffeinated foods.  Hypertension Chronic and unstable Advised to measure his BP at home, low salt diet and regular exercise On Amlodipine and Lisinopril. -Continue Amlodipine 5mg  and Lisinopril 40mg .  Hyperlipidemia Chronic and LDL was 122 8 month ago The 10-year ASCVD risk score (Arnett DK, et al., 2019) is: 21.5% Started taking cholesterol med on 03/06/23 On Lipitor 10mg  -Continue Lipitor.  Prediabetes Chronic and stable A1c in prediabetic range in July/5.7 -Be conscientious about carbohydrate intake.  Tobacco Use Half a pack a day for over 40 years. -Encouraged to quit smoking.  General Health Maintenance -Complete blood work today, except for H. Pylori test. -Return tomorrow for H. Pylori test. -Follow-up appointment in approximately one month.     Encounter to establish care Welcomed to our clinic Reviewed past medical hx, social hx, family hx and surgical hx Pt advised to send all vaccination records or screening   Return in about 4 weeks (around 12/01/2023) for chronic disease f/u.    The patient was advised to call back or seek an in-person evaluation if the symptoms worsen or if the condition fails to improve as anticipated.  I discussed the assessment and treatment plan with the patient. The patient was provided an opportunity to ask questions and all were answered. The patient agreed with the plan and demonstrated an understanding of the instructions.  I, Debera Lat,  PA-C have reviewed all documentation for this visit. The documentation on  11/03/23 for the exam, diagnosis, procedures, and orders are all accurate and complete.  Debera Lat, [facility],  MMS [facility] [phone] (phone) [phone] (fax)  [facility]

## 2023-11-05 LAB — TSH: TSH: 1.35 u[IU]/mL (ref 0.450–4.500)

## 2023-11-05 LAB — COMPREHENSIVE METABOLIC PANEL
ALT: 12 [IU]/L (ref 0–44)
AST: 21 IU/L (ref 0–40)
Albumin: 4.7 g/dL (ref 3.8–4.9)
Alkaline Phosphatase: 106 IU/L (ref 44–121)
BUN/Creatinine Ratio: 8 — ABNORMAL LOW (ref 9–20)
BUN: 11 mg/dL (ref 6–24)
Bilirubin Total: 0.4 mg/dL (ref 0.0–1.2)
CO2: 28 mmol/L (ref 20–29)
Calcium: 9.9 mg/dL (ref 8.7–10.2)
Chloride: 99 mmol/L (ref 96–106)
Creatinine, Ser: 1.3 mg/dL — ABNORMAL HIGH (ref 0.76–1.27)
Globulin, Total: 2.7 g/dL (ref 1.5–4.5)
Glucose: 89 mg/dL (ref 70–99)
Potassium: 4.8 mmol/L (ref 3.5–5.2)
Sodium: 138 mmol/L (ref 134–144)
Total Protein: 7.4 g/dL (ref 6.0–8.5)
eGFR: 66 mL/min/{1.73_m2} (ref >59)

## 2023-11-05 LAB — CBC WITH DIFFERENTIAL/PLATELET
Basophils Absolute: 0.1 10*3/uL (ref 0.0–0.2)
Basos: 1 %
EOS (ABSOLUTE): 0.3 10*3/uL (ref 0.0–0.4)
Eos: 6 %
Hematocrit: 49.3 % (ref 37.5–51.0)
Hemoglobin: 16.5 g/dL (ref 13.0–17.7)
Immature Grans (Abs): 0 10*3/uL (ref 0.0–0.1)
Immature Granulocytes: 0 %
Lymphocytes Absolute: 1.6 10*3/uL (ref 0.7–3.1)
Lymphs: 36 %
MCH: 31.9 pg (ref 26.6–33.0)
MCHC: 33.5 g/dL (ref 31.5–35.7)
MCV: 95 fL (ref 79–97)
Monocytes Absolute: 0.5 10*3/uL (ref 0.1–0.9)
Monocytes: 11 %
Neutrophils Absolute: 2.1 10*3/uL (ref 1.4–7.0)
Neutrophils: 46 %
Platelets: 209 10*3/uL (ref 150–450)
RBC: 5.18 x10E6/uL (ref 4.14–5.80)
RDW: 12 % (ref 11.6–15.4)
WBC: 4.5 10*3/uL (ref 3.4–10.8)

## 2023-11-05 LAB — LIPID PANEL
Chol/HDL Ratio: 2.7 ratio (ref 0.0–5.0)
Cholesterol, Total: 229 mg/dL — ABNORMAL HIGH (ref 100–199)
HDL: 85 mg/dL (ref >39)
LDL Chol Calc (NIH): 129 mg/dL — ABNORMAL HIGH (ref 0–99)
Triglycerides: 87 mg/dL (ref 0–149)
VLDL Cholesterol Cal: 15 mg/dL (ref 5–40)

## 2023-11-05 LAB — HEMOGLOBIN A1C
Est. average glucose Bld gHb Est-mCnc: 123 mg/dL
Hgb A1c MFr Bld: 5.9 % — ABNORMAL HIGH (ref 4.8–5.6)

## 2023-11-05 LAB — H. PYLORI BREATH TEST: H pylori Breath Test: NEGATIVE

## 2023-11-27 ENCOUNTER — Other Ambulatory Visit: Payer: Self-pay | Admitting: Family Medicine

## 2023-11-27 DIAGNOSIS — I10 Essential (primary) hypertension: Secondary | ICD-10-CM

## 2023-12-01 ENCOUNTER — Encounter: Payer: Self-pay | Admitting: Physician Assistant

## 2023-12-01 ENCOUNTER — Ambulatory Visit: Payer: BC Managed Care – PPO | Admitting: Physician Assistant

## 2023-12-01 VITALS — BP 160/99 | HR 73 | Temp 97.8°F | Resp 18 | Ht 69.0 in | Wt 163.7 lb

## 2023-12-01 DIAGNOSIS — I10 Essential (primary) hypertension: Secondary | ICD-10-CM | POA: Diagnosis not present

## 2023-12-01 MED ORDER — HYDROCHLOROTHIAZIDE 12.5 MG PO TABS
12.5000 mg | ORAL_TABLET | Freq: Every day | ORAL | 3 refills | Status: DC
Start: 2023-12-01 — End: 2023-12-07

## 2023-12-01 NOTE — Progress Notes (Unsigned)
Established patient visit  Patient: Calvin Taylor   DOB: 1971-08-02   52 y.o. Male  MRN: [id number] Visit Date: 12/01/2023  Today's healthcare provider: Debera Lat, PA-C   Chief Complaint  Patient presents with   Medical Management of Chronic Issues   Subjective       Discussed the use of AI scribe software for clinical note transcription with the patient, who gave verbal consent to proceed.  History of Present Illness   The patient, with a history of prediabetes and high cholesterol, presents with worsening wheezing and coughing, particularly at night, which has led to sleeping in a sitting position. They report a sensation of 'something going on in your chest' and coughing for 2-3 hours at night. They have been using their wife's Advair inhaler, which helps them sleep.  The patient also reports worsening heartburn, despite taking pantoprazole and Pepcid, and occasionally Tums. They have been adhering to advice to not eat 2-3 hours before bed, but their diet on the road includes burgers and other potentially aggravating foods.  The patient also reports high blood pressure, despite taking two medications. They express concern about feeling like their body is about to 'shut down' if they don't eat by 9 or 10 in the morning. They also report a history of smoking half a pack a day, which they continue to do despite their coughing.  The patient also mentions a future appointment with an eye doctor, and expresses concern about a constant sensation of something on one side of their nose, which they believe may be due to seasonal allergies.           12/01/2023    9:21 AM 11/03/2023   10:08 AM 06/23/2023    8:38 AM  Depression screen PHQ 2/9  Decreased Interest 0 0 0  Down, Depressed, Hopeless 0 0 0  PHQ - 2 Score 0 0 0  Altered sleeping 1  0  Tired, decreased energy 0  0  Change in appetite 0  0  Feeling bad or failure about yourself  0  0  Trouble concentrating 0  0  Moving  slowly or fidgety/restless 0  0  Suicidal thoughts 0  0  PHQ-9 Score 1  0  Difficult doing work/chores Not difficult at all  Not difficult at all      12/01/2023    9:26 AM 11/03/2023   10:08 AM  GAD 7 : Generalized Anxiety Score  Nervous, Anxious, on Edge 0 0  Control/stop worrying 0 0  Worry too much - different things 0   Trouble relaxing 0   Restless 0   Easily annoyed or irritable 1   Afraid - awful might happen 0   Total GAD 7 Score 1   Anxiety Difficulty Not difficult at all Not difficult at all    Medications: Outpatient Medications Prior to Visit  Medication Sig   amLODipine (NORVASC) 5 MG tablet TAKE 1 TABLET(5 MG) BY MOUTH DAILY   atorvastatin (LIPITOR) 10 MG tablet TAKE 1 TABLET(10 MG) BY MOUTH DAILY   blood glucose meter kit and supplies KIT Dispense based on patient and insurance preference. Use up to four times daily as directed. (FOR ICD-10 E 16.2)   cromolyn (OPTICROM) 4 % ophthalmic solution Place 1 drop into both eyes 4 (four) times daily.   famotidine (PEPCID) 20 MG tablet Take 1 tablet (20 mg total) by mouth 2 (two) times daily as needed for heartburn or indigestion.   ketoconazole (NIZORAL) 2 % shampoo Apply  1 application topically 2 (two) times a week.   lisinopril (ZESTRIL) 40 MG tablet Take 1 tablet (40 mg total) by mouth daily.   pantoprazole (PROTONIX) 40 MG tablet Take 1 tablet (40 mg total) by mouth daily.   tadalafil (CIALIS) 20 MG tablet TAKE 1 TABLET(20 MG) BY MOUTH EVERY OTHER DAY AS NEEDED FOR ERECTILE DYSFUNCTION   thiamine (VITAMIN B1) 100 MG tablet Take 1 tablet (100 mg total) by mouth daily.   tiZANidine (ZANAFLEX) 4 MG tablet TAKE 1 TABLET(4 MG) BY MOUTH AT BEDTIME AS NEEDED FOR MUSCLE SPASMS   loratadine (CLARITIN) 10 MG tablet Take 1 tablet (10 mg total) by mouth daily.   No facility-administered medications prior to visit.    Review of Systems All negative Except see HPI       Objective    BP (!) 160/99   Pulse 73   Temp 97.8  F (36.6 C)   Resp 18   Ht 5\' 9"  (1.753 m)   Wt 163 lb 11.2 oz (74.3 kg)   SpO2 100%   BMI 24.17 kg/m     Physical Exam Vitals reviewed.  Constitutional:      General: He is not in acute distress.    Appearance: Normal appearance. He is not diaphoretic.  HENT:     Head: Normocephalic and atraumatic.  Eyes:     General: No scleral icterus.    Conjunctiva/sclera: Conjunctivae normal.  Cardiovascular:     Rate and Rhythm: Normal rate and regular rhythm.     Pulses: Normal pulses.     Heart sounds: Normal heart sounds. No murmur heard. Pulmonary:     Effort: Pulmonary effort is normal. No respiratory distress.     Breath sounds: Normal breath sounds. No wheezing or rhonchi.  Musculoskeletal:     Cervical back: Neck supple.     Right lower leg: No edema.     Left lower leg: No edema.  Lymphadenopathy:     Cervical: No cervical adenopathy.  Skin:    General: Skin is warm and dry.     Findings: No rash.  Neurological:     Mental Status: He is alert and oriented to person, place, and time. Mental status is at baseline.  Psychiatric:        Mood and Affect: Mood normal.        Behavior: Behavior normal.      No results found for any visits on 12/01/23.      Assessment and Plan    Gastroesophageal Reflux Disease (GERD) Worsening symptoms despite Pantoprazole and Pepcid. Poor dietary habits noted. Discussed the risk of precancerous changes and cancer if not controlled. -Increase Pepcid to 40mg  twice daily. -Strict dietary modifications advised:avoid acidic, spicy, and heavy meals, eat 80% full, avoid eating 3-4 hours before bed, avoid caffeine, chocolate, oranges, tomatoes, and juices. -Consider upper endoscopy if symptoms persist despite medication and dietary changes.  Chronic Cough Wheezing and coughing for 2-3 hours at night, requiring patient to sleep sitting up. Albuterol inhaler provides relief. -Prescribe Albuterol inhaler for symptomatic relief. -Consider  referral to Pulmonology if daily use of inhaler becomes necessary.  Hypertension chronic Blood pressure is high despite current medications. -Add another antihypertensive medication. -Monitor blood pressure daily and return in 2-4 weeks with blood pressure records.  Prediabetes Chronic Advised on dietary modifications to prevent progression to diabetes. -Avoid soft drinks and sweets. -Eat small frequent meals. Will recheck A1c in 2 month  Tobacco Use Half a pack per day smoker  with a chronic cough. -Encouraged smoking cessation and offered assistance with patches, gum, or Chantix. -Continue annual lung cancer screening due to 40-year history of smoking.  Allergic Rhinitis Nasal congestion and possible seasonal allergies. -Start over-the-counter antihistamines (Allegra, Claritin, Zyrtec). -Use nasal saline rinse or Flonase as needed. -Keep a food diary to identify potential triggers.  Hyperlipidemia Chronic, last ldl was 129 in 10/2023 The 10-year ASCVD risk score (Arnett DK, et al., 2019) is: 21.1% A dose was increased to 20mg  after 10/2023 -Continue Atorvastatin 20mg . Will repeat LP Continue low cholesterol diet and daily exercise  Follow-up in 2-4 weeks to monitor blood pressure and GERD symptoms.      No orders of the defined types were placed in this encounter.   Return in about 4 weeks (around 12/29/2023) for BP f/u, chronic disease f/u.   The patient was advised to call back or seek an in-person evaluation if the symptoms worsen or if the condition fails to improve as anticipated.  I discussed the assessment and treatment plan with the patient. The patient was provided an opportunity to ask questions and all were answered. The patient agreed with the plan and demonstrated an understanding of the instructions.  I, Debera Lat, PA-C have reviewed all documentation for this visit. The documentation on 12/01/2023  for the exam, diagnosis, procedures, and orders are all  accurate and complete.  Debera Lat, [facility], MMS [facility] [phone] (phone) [phone] (fax)  [facility]

## 2023-12-07 ENCOUNTER — Encounter: Payer: Self-pay | Admitting: Nurse Practitioner

## 2023-12-07 ENCOUNTER — Ambulatory Visit: Payer: BC Managed Care – PPO | Admitting: Nurse Practitioner

## 2023-12-07 ENCOUNTER — Encounter: Payer: BC Managed Care – PPO | Admitting: Nurse Practitioner

## 2023-12-07 VITALS — BP 148/88 | HR 82 | Temp 97.7°F | Resp 18 | Ht 69.5 in | Wt 163.3 lb

## 2023-12-07 DIAGNOSIS — Z0001 Encounter for general adult medical examination with abnormal findings: Secondary | ICD-10-CM | POA: Diagnosis not present

## 2023-12-07 DIAGNOSIS — E785 Hyperlipidemia, unspecified: Secondary | ICD-10-CM | POA: Diagnosis not present

## 2023-12-07 DIAGNOSIS — I10 Essential (primary) hypertension: Secondary | ICD-10-CM

## 2023-12-07 DIAGNOSIS — Z0000 Encounter for general adult medical examination without abnormal findings: Secondary | ICD-10-CM

## 2023-12-07 MED ORDER — NEBIVOLOL HCL 2.5 MG PO TABS: 2.5000 mg | ORAL_TABLET | Freq: Every day | ORAL | 0 refills | Status: DC

## 2023-12-07 NOTE — Progress Notes (Signed)
Name: Calvin Taylor   MRN: [id number]    DOB: 1971/07/09   Date:12/07/2023       Progress Note  Subjective  Chief Complaint  Chief Complaint  Patient presents with   Annual Exam    HPI  Patient presents for annual CPE and HTN  HTN: patient currently taking amlodipine 5 mg daily, hydrochlorothiazide 12.5 mg daily, lisinopril 40 mg daily. Patient blood pressure continues to be elevated. Patient also reports that he is having a hard time with the hydrochlorothiazide making him urinate all the time.  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  Will add on bistolic 5 mg daily discontinue hydrochlorothiazide.      12/07/2023    2:18 PM 12/01/2023    9:29 AM 12/01/2023    9:20 AM  Vitals with BMI  Height 5' 9.5"  5\' 9"   Weight 163 lbs 5 oz  163 lbs 11 oz  BMI 23.78  24.16  Systolic 148 160 010  Diastolic 88 99 93  Pulse 82  73        IPSS Questionnaire (AUA-7): Over the past month.   1)  How often have you had a sensation of not emptying your bladder completely after you finish urinating?  0 - Not at all  2)  How often have you had to urinate again less than two hours after you finished urinating? 0 - Not at all  3)  How often have you found you stopped and started again several times when you urinated?  0 - Not at all  4) How difficult have you found it to postpone urination?  0 - Not at all  5) How often have you had a weak urinary stream?  0 - Not at all  6) How often have you had to push or strain to begin urination?  0 - Not at all  7) How many times did you most typically get up to urinate from the time you went to bed until the time you got up in the morning?  0 - None  Total score:  0-7 mildly symptomatic   8-19 moderately symptomatic   20-35 severely symptomatic     Health Maintenance  Topic Date Due   COVID-19 Vaccine (2 - Pfizer risk series) 12/23/2023*   Zoster (Shingles) Vaccine (1 of 2) 03/06/2024*   Flu Shot  03/14/2024*   Screening for Lung  Cancer  06/03/2024*   Colon Cancer Screening  04/29/2026   DTaP/Tdap/Td vaccine (2 - Td or Tdap) 03/10/2029   Hepatitis C Screening  Completed   HIV Screening  Completed   HPV Vaccine  Aged Out  *Topic was postponed. The date shown is not the original due date.    Diet: he is a Naval architect, he eats fast food all the time Exercise: he says he is very active Sleep: 8 hours Last dental exam:been awhile Last eye exam: annually   Depression: phq 9 is negative    12/07/2023    2:12 PM 12/01/2023    9:21 AM 11/03/2023   10:08 AM 06/23/2023    8:38 AM 06/04/2023   10:50 AM  Depression screen PHQ 2/9  Decreased Interest 0 0 0 0 0  Down, Depressed, Hopeless 0 0 0 0 0  PHQ - 2 Score 0 0 0 0 0  Altered sleeping 0 1  0 0  Tired, decreased energy 0 0  0 0  Change in appetite 0 0  0  0  Feeling bad or failure about yourself  0 0  0 0  Trouble concentrating 0 0  0 0  Moving slowly or fidgety/restless 0 0  0 0  Suicidal thoughts 0 0  0 0  PHQ-9 Score 0 1  0 0  Difficult doing work/chores Not difficult at all Not difficult at all  Not difficult at all Not difficult at all    Hypertension BP Readings from Last 3 Encounters:  12/07/23 (!) 148/88  12/01/23 (!) 160/99  11/03/23 (!) 160/103    Obesity: Wt Readings from Last 3 Encounters:  12/07/23 163 lb 4.8 oz (74.1 kg)  12/01/23 163 lb 11.2 oz (74.3 kg)  11/03/23 159 lb 4.8 oz (72.3 kg)   BMI Readings from Last 3 Encounters:  12/07/23 23.77 kg/m  12/01/23 24.17 kg/m  11/03/23 23.52 kg/m     Lipids:  Lab Results  Component Value Date   CHOL 229 (H) 11/03/2023   CHOL 216 (H) 03/06/2023   CHOL 197 09/02/2021   Lab Results  Component Value Date   HDL 85 11/03/2023   HDL 75 03/06/2023   HDL 89 09/02/2021   Lab Results  Component Value Date   LDLCALC 129 (H) 11/03/2023   LDLCALC 122 (H) 03/06/2023   LDLCALC 86 09/02/2021   Lab Results  Component Value Date   TRIG 87 11/03/2023   TRIG 87 03/06/2023   TRIG 119  09/02/2021   Lab Results  Component Value Date   CHOLHDL 2.7 11/03/2023   CHOLHDL 2.9 03/06/2023   CHOLHDL 2.2 09/02/2021   No results found for: "LDLDIRECT" Glucose:  Glucose  Date Value Ref Range Status  11/03/2023 89 70 - 99 mg/dL Final   Glucose, Bld  Date Value Ref Range Status  03/06/2023 88 65 - 99 mg/dL Final    Comment:    .            Fasting reference interval .   09/02/2021 59 (L) 65 - 99 mg/dL Final    Comment:    .            Fasting reference interval .   08/27/2020 83 65 - 99 mg/dL Final    Comment:    .            Fasting reference interval .     Flowsheet Row Office Visit [facility] 12/07/20[facility] in [facility]  AUDIT-C Score 8       Single STD testing and prevention (HIV/chl/gon/syphilis): completed Hep C: completed  Skin cancer: Discussed monitoring for atypical lesions Colorectal cancer: 04/29/2021 Prostate cancer: 2020 Lab Results  Component Value Date   PSA 0.6 03/11/2019     Lung cancer:   Low Dose CT Chest recommended if Age 45-80 years, 30 pack-year currently smoking OR have quit w/in 15years. Patient does not qualify.   AAA:  The USPSTF recommends one-time screening with ultrasonography in men ages 68 to 23 years who have ever smoked ECG:  never done, ordered today  Vaccines:  HPV: up to at age 88 , ask insurance if age between 54-45  Shingrix: 71-64 yo and ask insurance if covered when patient above 46 yo Pneumonia:  educated and discussed with patient. Flu:  educated and discussed with patient.  Advanced Care Planning: A voluntary discussion about advance care planning including the explanation and discussion of advance directives.  Discussed health care proxy and Living will, and the patient was able to identify a health care proxy  as wife.  Patient does not have a living will at present time. If patient does have living will, I have requested they bring this to the clinic to be scanned in to their  chart.  Patient Active Problem List   Diagnosis Date Noted   Smoking 11/03/2023   Smokes less than 1 pack a day with greater than 30 pack year history 03/25/2021   Hyperlipidemia 03/25/2021   Erectile dysfunction 03/12/2020   Current smoker 03/12/2020   Medial epicondylitis of right elbow 07/26/2019   Gastroesophageal reflux disease without esophagitis 04/21/2019   Cyst, dermoid, scalp and neck 04/21/2019   Alcohol abuse 04/21/2019   Sebaceous cyst 03/16/2019   Borderline diabetes mellitus 04/30/2018   Essential hypertension 04/28/2018    Past Surgical History:  Procedure Laterality Date   COLONOSCOPY WITH PROPOFOL N/A 04/29/2021   Procedure: COLONOSCOPY WITH PROPOFOL;  Surgeon: Toney Reil, MD;  Location: [facility];  Service: Gastroenterology;  Laterality: N/A;   right eye surgery      Family History  Problem Relation Age of Onset   Hypertension Mother    Hypertension Father     Social History   Socioeconomic History   Marital status: Single    Spouse name: Not on file   Number of children: 1   Years of education: Not on file   Highest education level: Not on file  Occupational History   Not on file  Tobacco Use   Smoking status: Every Day    Current packs/day: 1.00    Average packs/day: 1 pack/day for 33.0 years (33.0 ttl pk-yrs)    Types: Cigarettes   Smokeless tobacco: Never   Tobacco comments:    Smoked 2ppd for 26 years, started smoking 52 y/o, 56 pack year hx  Vaping Use   Vaping status: Never Used  Substance and Sexual Activity   Alcohol use: Yes    Alcohol/week: 32.0 standard drinks of alcohol    Types: 30 Cans of beer, 2 Shots of liquor per week    Comment: per day beers 3 weekdays 6 weekends   Drug use: No   Sexual activity: Yes    Partners: Female  Other Topics Concern   Not on file  Social History Narrative   Works for a moving company   Social Drivers of Corporate investment banker Strain: Low Risk  (12/07/2023)   Overall  Financial Resource Strain (CARDIA)    Difficulty of Paying Living Expenses: Not hard at all  Food Insecurity: No Food Insecurity (12/07/2023)   Hunger Vital Sign    Worried About Running Out of Food in the Last Year: Never true    Ran Out of Food in the Last Year: Never true  Transportation Needs: No Transportation Needs (12/07/2023)   PRAPARE - Administrator, Civil Service (Medical): No    Lack of Transportation (Non-Medical): No  Physical Activity: Sufficiently Active (12/07/2023)   Exercise Vital Sign    Days of Exercise per Week: 5 days    Minutes of Exercise per Session: 150+ min  Stress: No Stress Concern Present (12/07/2023)   [employer] of Occupational Health - Occupational Stress Questionnaire    Feeling of Stress : Not at all  Social Connections: Moderately Integrated (12/07/2023)   Social Connection and Isolation Panel [NHANES]    Frequency of Communication with Friends and Family: Three times a week    Frequency of Social Gatherings with Friends and Family: Once a week    Attends Religious  Services: 1 to 4 times per year    Active Member of Clubs or Organizations: No    Attends Banker Meetings: Never    Marital Status: Married  Catering manager Violence: Not At Risk (12/07/2023)   Humiliation, Afraid, Rape, and Kick questionnaire    Fear of Current or Ex-Partner: No    Emotionally Abused: No    Physically Abused: No    Sexually Abused: No     Current Outpatient Medications:    amLODipine (NORVASC) 5 MG tablet, TAKE 1 TABLET(5 MG) BY MOUTH DAILY, Disp: 90 tablet, Rfl: 1   atorvastatin (LIPITOR) 10 MG tablet, TAKE 1 TABLET(10 MG) BY MOUTH DAILY, Disp: 90 tablet, Rfl: 0   blood glucose meter kit and supplies KIT, Dispense based on patient and insurance preference. Use up to four times daily as directed. (FOR ICD-10 E 16.2), Disp: 1 each, Rfl: 0   famotidine (PEPCID) 20 MG tablet, Take 1 tablet (20 mg total) by mouth 2 (two) times daily  as needed for heartburn or indigestion., Disp: 180 tablet, Rfl: 1   lisinopril (ZESTRIL) 40 MG tablet, Take 1 tablet (40 mg total) by mouth daily., Disp: 30 tablet, Rfl: 2   nebivolol (BYSTOLIC) 2.5 MG tablet, Take 1 tablet (2.5 mg total) by mouth daily., Disp: 30 tablet, Rfl: 0   pantoprazole (PROTONIX) 40 MG tablet, Take 1 tablet (40 mg total) by mouth daily., Disp: 30 tablet, Rfl: 0   tadalafil (CIALIS) 20 MG tablet, TAKE 1 TABLET(20 MG) BY MOUTH EVERY OTHER DAY AS NEEDED FOR ERECTILE DYSFUNCTION, Disp: 10 tablet, Rfl: 1   thiamine (VITAMIN B1) 100 MG tablet, Take 1 tablet (100 mg total) by mouth daily., Disp: 90 tablet, Rfl: 1   tiZANidine (ZANAFLEX) 4 MG tablet, TAKE 1 TABLET(4 MG) BY MOUTH AT BEDTIME AS NEEDED FOR MUSCLE SPASMS, Disp: 30 tablet, Rfl: 0   loratadine (CLARITIN) 10 MG tablet, Take 1 tablet (10 mg total) by mouth daily., Disp: 90 tablet, Rfl: 3  No Known Allergies   ROS  Constitutional: Negative for fever or weight change.  Respiratory: Negative for cough and shortness of breath.   Cardiovascular: Negative for chest pain or palpitations.  Gastrointestinal: Negative for abdominal pain, no bowel changes.  Musculoskeletal: Negative for gait problem or joint swelling.  Skin: Negative for rash.  Neurological: Negative for dizziness or headache.  No other specific complaints in a complete review of systems (except as listed in HPI above).    Objective  Vitals:   12/07/23 1418  BP: (!) 148/88  Pulse: 82  Resp: 18  Temp: 97.7 F (36.5 C)  TempSrc: Oral  SpO2: 99%  Weight: 163 lb 4.8 oz (74.1 kg)  Height: 5' 9.5" (1.765 m)    Body mass index is 23.77 kg/m.  Physical Exam Constitutional: Patient appears well-developed and well-nourished. No distress.  HENT: Head: Normocephalic and atraumatic. Ears: B TMs ok, no erythema or effusion; Nose: Nose normal. Mouth/Throat: Oropharynx is clear and moist. No oropharyngeal exudate.  Eyes: Conjunctivae and EOM are normal.  Pupils are equal, round, and reactive to light. No scleral icterus.  Neck: Normal range of motion. Neck supple. No JVD present. No thyromegaly present.  Cardiovascular: Normal rate, regular rhythm and normal heart sounds.  No murmur heard. No BLE edema. Pulmonary/Chest: Effort normal and breath sounds normal. No respiratory distress. Abdominal: Soft. Bowel sounds are normal, no distension. There is no tenderness. no masses Musculoskeletal: Normal range of motion, no joint effusions. No gross deformities Neurological:  he is alert and oriented to person, place, and time. No cranial nerve deficit. Coordination, balance, strength, speech and gait are normal.  Skin: Skin is warm and dry. No rash noted. No erythema.  Psychiatric: Patient has a normal mood and affect. behavior is normal. Judgment and thought content normal.   Recent Results (from the past 2160 hours)  Hemoglobin A1c     Status: Abnormal   Collection Time: 11/03/23 10:42 AM  Result Value Ref Range   Hgb A1c MFr Bld 5.9 (H) 4.8 - 5.6 %    Comment:          Prediabetes: 5.7 - 6.4          Diabetes: >6.4          Glycemic control for adults with diabetes: <7.0    Est. average glucose Bld gHb Est-mCnc 123 mg/dL  Lipid panel     Status: Abnormal   Collection Time: 11/03/23 10:42 AM  Result Value Ref Range   Cholesterol, Total 229 (H) 100 - 199 mg/dL   Triglycerides 87 0 - 149 mg/dL   HDL 85 >04 mg/dL   VLDL Cholesterol Cal 15 5 - 40 mg/dL   LDL Chol Calc (NIH) 540 (H) 0 - 99 mg/dL   Chol/HDL Ratio 2.7 0.0 - 5.0 ratio    Comment:                                   T. Chol/HDL Ratio                                             Men  Women                               1/2 Avg.Risk  3.4    3.3                                   Avg.Risk  5.0    4.4                                2X Avg.Risk  9.6    7.1                                3X Avg.Risk 23.4   11.0   CBC with Differential/Platelet     Status: None   Collection Time: 11/03/23  10:42 AM  Result Value Ref Range   WBC 4.5 3.4 - 10.8 x10E3/uL    Comment: **Effective November 16, 2023 profile [id number] WBC will be made**   non-orderable as a stand-alone order code.    RBC 5.18 4.14 - 5.80 x10E6/uL   Hemoglobin 16.5 13.0 - 17.7 g/dL   Hematocrit 47.8 29.5 - 51.0 %   MCV 95 79 - 97 fL   MCH 31.9 26.6 - 33.0 pg   MCHC 33.5 31.5 - 35.7 g/dL   RDW 62.1 30.8 - 65.7 %   Platelets 209 150 - 450 x10E3/uL   Neutrophils 46 Not Estab. %   Lymphs 36 Not Estab. %  Monocytes 11 Not Estab. %   Eos 6 Not Estab. %   Basos 1 Not Estab. %   Neutrophils Absolute 2.1 1.4 - 7.0 x10E3/uL   Lymphocytes Absolute 1.6 0.7 - 3.1 x10E3/uL   Monocytes Absolute 0.5 0.1 - 0.9 x10E3/uL   EOS (ABSOLUTE) 0.3 0.0 - 0.4 x10E3/uL   Basophils Absolute 0.1 0.0 - 0.2 x10E3/uL   Immature Granulocytes 0 Not Estab. %   Immature Grans (Abs) 0.0 0.0 - 0.1 x10E3/uL  Comprehensive metabolic panel     Status: Abnormal   Collection Time: 11/03/23 10:42 AM  Result Value Ref Range   Glucose 89 70 - 99 mg/dL   BUN 11 6 - 24 mg/dL   Creatinine, Ser 1.61 (H) 0.76 - 1.27 mg/dL   eGFR 66 >09 UE/AVW/0.98   BUN/Creatinine Ratio 8 (L) 9 - 20   Sodium 138 134 - 144 mmol/L   Potassium 4.8 3.5 - 5.2 mmol/L   Chloride 99 96 - 106 mmol/L   CO2 28 20 - 29 mmol/L   Calcium 9.9 8.7 - 10.2 mg/dL   Total Protein 7.4 6.0 - 8.5 g/dL   Albumin 4.7 3.8 - 4.9 g/dL   Globulin, Total 2.7 1.5 - 4.5 g/dL   Bilirubin Total 0.4 0.0 - 1.2 mg/dL   Alkaline Phosphatase 106 44 - 121 IU/L   AST 21 0 - 40 IU/L   ALT 12 0 - 44 IU/L  TSH     Status: None   Collection Time: 11/03/23 10:42 AM  Result Value Ref Range   TSH 1.350 0.450 - 4.500 uIU/mL  H. pylori breath test     Status: None   Collection Time: 11/03/23 10:42 AM  Result Value Ref Range   H pylori Breath Test Negative Negative     Fall Risk:    12/07/2023    2:16 PM 11/03/2023   10:08 AM 06/23/2023    8:35 AM 06/04/2023   10:50 AM 05/01/2023   10:20 AM  Fall Risk    Falls in the past year? 0 0 0 0 0  Number falls in past yr:  0 0 0 0  Injury with Fall?  0 0 0 0  Risk for fall due to : No Fall Risks No Fall Risks  No Fall Risks   Follow up Falls prevention discussed;Education provided;Falls evaluation completed   Falls prevention discussed;Education provided;Falls evaluation completed      Functional Status Survey: Is the patient deaf or have difficulty hearing?: No Does the patient have difficulty seeing, even when wearing glasses/contacts?: No Does the patient have difficulty concentrating, remembering, or making decisions?: No Does the patient have difficulty walking or climbing stairs?: No Does the patient have difficulty dressing or bathing?: No Does the patient have difficulty doing errands alone such as visiting a doctor's office or shopping?: No   Assessment & Plan  1. Annual physical exam (Primary) Up to date on labs  2. Essential hypertension Stop hydrochlorothiazide Continue amlodipine 5 mg daily, lisinopril 40 mg daily - nebivolol (BYSTOLIC) 2.5 MG tablet; Take 1 tablet (2.5 mg total) by mouth daily.  Dispense: 30 tablet; Refill: 0  3. Hyperlipidemia, unspecified hyperlipidemia type Continue atorvastatin 10 mg daily    -Prostate cancer screening and PSA options (with potential risks and benefits of testing vs not testing) were discussed along with recent recs/guidelines. -USPSTF grade A and B recommendations reviewed with patient; age-appropriate recommendations, preventive care, screening tests, etc discussed and encouraged; healthy living encouraged; see AVS for patient  education given to patient -Discussed importance of 150 minutes of physical activity weekly, eat two servings of fish weekly, eat one serving of tree nuts ( cashews, pistachios, pecans, almonds.[name] Kitchen) every other day, eat 6 servings of fruit/vegetables daily and drink plenty of water and avoid sweet beverages.  -Reviewed Health Maintenance: yes

## 2023-12-14 ENCOUNTER — Other Ambulatory Visit: Payer: Self-pay

## 2023-12-14 ENCOUNTER — Telehealth: Payer: Self-pay | Admitting: *Deleted

## 2023-12-14 DIAGNOSIS — Z122 Encounter for screening for malignant neoplasm of respiratory organs: Secondary | ICD-10-CM

## 2023-12-14 DIAGNOSIS — Z87891 Personal history of nicotine dependence: Secondary | ICD-10-CM

## 2023-12-14 DIAGNOSIS — F17210 Nicotine dependence, cigarettes, uncomplicated: Secondary | ICD-10-CM

## 2023-12-14 NOTE — Telephone Encounter (Signed)
Spoke with pt and gave him the billing codes to check his coverage for lung cancer screening. Also gave pt our call back number to call when ready to schedule.

## 2023-12-29 ENCOUNTER — Ambulatory Visit (INDEPENDENT_AMBULATORY_CARE_PROVIDER_SITE_OTHER): Payer: BC Managed Care – PPO | Admitting: Physician Assistant

## 2023-12-29 ENCOUNTER — Encounter: Payer: Self-pay | Admitting: Physician Assistant

## 2023-12-29 ENCOUNTER — Ambulatory Visit
Admission: RE | Admit: 2023-12-29 | Discharge: 2023-12-29 | Disposition: A | Discharge status: Home / Self Care | Payer: BC Managed Care – PPO | Source: Ambulatory Visit | Attending: Acute Care | Admitting: Acute Care

## 2023-12-29 VITALS — BP 136/88 | HR 87 | Resp 16 | Ht 69.0 in | Wt 170.0 lb

## 2023-12-29 DIAGNOSIS — Z122 Encounter for screening for malignant neoplasm of respiratory organs: Secondary | ICD-10-CM | POA: Insufficient documentation

## 2023-12-29 DIAGNOSIS — F17210 Nicotine dependence, cigarettes, uncomplicated: Secondary | ICD-10-CM | POA: Insufficient documentation

## 2023-12-29 DIAGNOSIS — J4520 Mild intermittent asthma, uncomplicated: Secondary | ICD-10-CM

## 2023-12-29 DIAGNOSIS — Z87891 Personal history of nicotine dependence: Secondary | ICD-10-CM | POA: Insufficient documentation

## 2023-12-29 DIAGNOSIS — E785 Hyperlipidemia, unspecified: Secondary | ICD-10-CM

## 2023-12-29 DIAGNOSIS — J45909 Unspecified asthma, uncomplicated: Secondary | ICD-10-CM | POA: Insufficient documentation

## 2023-12-29 DIAGNOSIS — K219 Gastro-esophageal reflux disease without esophagitis: Secondary | ICD-10-CM

## 2023-12-29 DIAGNOSIS — I10 Essential (primary) hypertension: Secondary | ICD-10-CM

## 2023-12-29 MED ORDER — LISINOPRIL 40 MG PO TABS: 40.0000 mg | ORAL_TABLET | Freq: Every day | ORAL | 0 refills | Status: DC

## 2023-12-29 MED ORDER — ALBUTEROL SULFATE HFA 108 (90 BASE) MCG/ACT IN AERS: 2.0000 | INHALATION_SPRAY | Freq: Four times a day (QID) | RESPIRATORY_TRACT | 0 refills | Status: DC | PRN

## 2023-12-29 MED ORDER — PANTOPRAZOLE SODIUM 40 MG PO TBEC: 40.0000 mg | DELAYED_RELEASE_TABLET | Freq: Every day | ORAL | 0 refills | Status: DC

## 2023-12-29 MED ORDER — NEBIVOLOL HCL 2.5 MG PO TABS: 2.5000 mg | ORAL_TABLET | Freq: Every day | ORAL | 0 refills | Status: DC

## 2023-12-29 MED ORDER — ATORVASTATIN CALCIUM 10 MG PO TABS: 10.0000 mg | ORAL_TABLET | Freq: Every day | ORAL | 0 refills | Status: DC

## 2023-12-29 NOTE — Assessment & Plan Note (Signed)
 Due for statin refills, Refills provided Recheck lipid panel at follow up for monitoring

## 2023-12-29 NOTE — Assessment & Plan Note (Signed)
 Chronic, historic condition Appears to be controlled with current regimen  Taking Pepcid  20 mg PO BID and Protonix  40 mg PO every day and appears to be tolerating well  Continue current regimen Avoid trigger foods and alcohol Follow up in 6 months or sooner if concerns arise

## 2023-12-29 NOTE — Progress Notes (Signed)
 Established Patient Office Visit  Name: Calvin Taylor   MRN: [id number]    DOB: 04-30-1971   Date:12/29/2023  Today's Provider: [place] Mt, MHS, PA-C Introduced myself to the patient as a PA-C and provided education on APPs in clinical practice.         Subjective  Chief Complaint  Chief Complaint  Patient presents with   Hypertension    4 week follow-up    HPI   Patient was having difficulty with previous hydrochlorothiazide  due to increased urination. 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 Hydrochlorothiazide  was discontinued.   He has been taking Lisinopril  as directed  He denies concerns for side effects with the Nebivolol  at this time   He does report some chest pains associated with heartburn- reports increased coughing that causes soreness in chest particularly at night  He has been taking Protonix  and Pepcid   but reports he is wheezing at night and he will cough up foamy phlegm   He states when he lays down at night he will start wheezing then coughing but denies continued burning in chest, bad taste in mouth or chest pressure  He states he has tried using his wife's asthma pump and this has helped significantly with the coughing     Patient Active Problem List   Diagnosis Date Noted   Reactive airway disease with wheezing 12/29/2023   Smoking 11/03/2023   Smokes less than 1 pack a day with greater than 30 pack year history 03/25/2021   Hyperlipidemia 03/25/2021   Erectile dysfunction 03/12/2020   Current smoker 03/12/2020   Medial epicondylitis of right elbow 07/26/2019   Gastroesophageal reflux disease without esophagitis 04/21/2019   Cyst, dermoid, scalp and neck 04/21/2019   Alcohol abuse 04/21/2019   Sebaceous cyst 03/16/2019   Borderline diabetes mellitus 04/30/2018   Essential hypertension 04/28/2018    Past Surgical History:  Procedure Laterality Date    COLONOSCOPY WITH PROPOFOL  N/A 04/29/2021   Procedure: COLONOSCOPY WITH PROPOFOL ;  Surgeon: Unk Corinn Skiff, MD;  Location: [facility] ENDOSCOPY;  Service: Gastroenterology;  Laterality: N/A;   right eye surgery      Family History  Problem Relation Age of Onset   Hypertension Mother    Hypertension Father     Social History   Tobacco Use   Smoking status: Every Day    Current packs/day: 1.00    Average packs/day: 1 pack/day for 33.0 years (33.0 ttl pk-yrs)    Types: Cigarettes   Smokeless tobacco: Never   Tobacco comments:    Smoked 2ppd for 26 years, started smoking 53 y/o, 56 pack year hx  Substance Use Topics   Alcohol use: Yes    Alcohol/week: 32.0 standard drinks of alcohol    Types: 30 Cans of beer, 2 Shots of liquor per week    Comment: per day beers 3 weekdays 6 weekends     Current Outpatient Medications:    albuterol  (VENTOLIN  HFA) 108 (90 Base) MCG/ACT inhaler, Inhale 2 puffs into the lungs every 6 (six) hours as needed for wheezing or shortness of breath., Disp: 8 g, Rfl: 0   amLODipine  (NORVASC ) 5 MG tablet, TAKE 1 TABLET(5 MG) BY MOUTH DAILY, Disp: 90 tablet, Rfl: 1   blood glucose meter kit and supplies KIT, Dispense based on patient and insurance preference. Use up to four times daily as directed. (FOR ICD-10  E 16.2), Disp: 1 each, Rfl: 0   famotidine  (PEPCID ) 20 MG tablet, Take 1 tablet (20 mg total) by mouth 2 (two) times daily as needed for heartburn or indigestion., Disp: 180 tablet, Rfl: 1   tadalafil  (CIALIS ) 20 MG tablet, TAKE 1 TABLET(20 MG) BY MOUTH EVERY OTHER DAY AS NEEDED FOR ERECTILE DYSFUNCTION, Disp: 10 tablet, Rfl: 1   thiamine  (VITAMIN B1) 100 MG tablet, Take 1 tablet (100 mg total) by mouth daily., Disp: 90 tablet, Rfl: 1   tiZANidine  (ZANAFLEX ) 4 MG tablet, TAKE 1 TABLET(4 MG) BY MOUTH AT BEDTIME AS NEEDED FOR MUSCLE SPASMS, Disp: 30 tablet, Rfl: 0   atorvastatin  (LIPITOR) 10 MG tablet, Take 1 tablet (10 mg total) by mouth daily., Disp: 90 tablet,  Rfl: 0   lisinopril  (ZESTRIL ) 40 MG tablet, Take 1 tablet (40 mg total) by mouth daily., Disp: 90 tablet, Rfl: 0   loratadine  (CLARITIN ) 10 MG tablet, Take 1 tablet (10 mg total) by mouth daily., Disp: 90 tablet, Rfl: 3   nebivolol  (BYSTOLIC ) 2.5 MG tablet, Take 1 tablet (2.5 mg total) by mouth daily., Disp: 90 tablet, Rfl: 0   pantoprazole  (PROTONIX ) 40 MG tablet, Take 1 tablet (40 mg total) by mouth daily., Disp: 90 tablet, Rfl: 0  No Known Allergies  I personally reviewed active problem list, medication list, allergies, notes from last encounter with the patient/caregiver today.   Review of Systems  Constitutional:  Negative for malaise/fatigue.  Respiratory:  Negative for shortness of breath and wheezing.   Cardiovascular:  Negative for chest pain, palpitations and leg swelling.  Gastrointestinal:  Positive for heartburn.  Neurological:  Negative for dizziness and headaches.      Objective  Vitals:   12/29/23 0928  BP: 136/88  Pulse: 87  Resp: 16  Weight: 170 lb (77.1 kg)  Height: 5' 9 (1.753 m)    Body mass index is 25.1 kg/m.  Physical Exam Vitals reviewed.  Constitutional:      General: He is awake.     Appearance: Normal appearance. He is well-developed and well-groomed.  HENT:     Head: Normocephalic and atraumatic.  Cardiovascular:     Rate and Rhythm: Normal rate and regular rhythm.     Pulses: Normal pulses.          Radial pulses are 2+ on the right side and 2+ on the left side.     Heart sounds: Normal heart sounds. No murmur heard.    No friction rub. No gallop.  Pulmonary:     Effort: Pulmonary effort is normal.     Breath sounds: Normal breath sounds. No decreased air movement. No decreased breath sounds, wheezing, rhonchi or rales.  Musculoskeletal:     Cervical back: Normal range of motion.     Right lower leg: No edema.     Left lower leg: No edema.  Neurological:     General: No focal deficit present.     Mental Status: He is alert and  oriented to person, place, and time. Mental status is at baseline.     GCS: GCS eye subscore is 4. GCS verbal subscore is 5. GCS motor subscore is 6.  Psychiatric:        Attention and Perception: Attention and perception normal.        Mood and Affect: Mood and affect normal.        Speech: Speech normal.        Behavior: Behavior normal. Behavior is cooperative.  Thought Content: Thought content normal.        Cognition and Memory: Cognition normal.      Recent Results (from the past 2160 hours)  Hemoglobin A1c     Status: Abnormal   Collection Time: 11/03/23 10:42 AM  Result Value Ref Range   Hgb A1c MFr Bld 5.9 (H) 4.8 - 5.6 %    Comment:          Prediabetes: 5.7 - 6.4          Diabetes: >6.4          Glycemic control for adults with diabetes: <7.0    Est. average glucose Bld gHb Est-mCnc 123 mg/dL  Lipid panel     Status: Abnormal   Collection Time: 11/03/23 10:42 AM  Result Value Ref Range   Cholesterol, Total 229 (H) 100 - 199 mg/dL   Triglycerides 87 0 - 149 mg/dL   HDL 85 >60 mg/dL   VLDL Cholesterol Cal 15 5 - 40 mg/dL   LDL Chol Calc (NIH) 870 (H) 0 - 99 mg/dL   Chol/HDL Ratio 2.7 0.0 - 5.0 ratio    Comment:                                   T. Chol/HDL Ratio                                             Men  Women                               1/2 Avg.Risk  3.4    3.3                                   Avg.Risk  5.0    4.4                                2X Avg.Risk  9.6    7.1                                3X Avg.Risk 23.4   11.0   CBC with Differential/Platelet     Status: None   Collection Time: 11/03/23 10:42 AM  Result Value Ref Range   WBC 4.5 3.4 - 10.8 x10E3/uL    Comment: **Effective November 16, 2023 profile [id number] WBC will be made**   non-orderable as a stand-alone order code.    RBC 5.18 4.14 - 5.80 x10E6/uL   Hemoglobin 16.5 13.0 - 17.7 g/dL   Hematocrit 50.6 62.4 - 51.0 %   MCV 95 79 - 97 fL   MCH 31.9 26.6 - 33.0 pg   MCHC 33.5 31.5 -  35.7 g/dL   RDW 87.9 88.3 - 84.5 %   Platelets 209 150 - 450 x10E3/uL   Neutrophils 46 Not Estab. %   Lymphs 36 Not Estab. %   Monocytes 11 Not Estab. %   Eos 6 Not Estab. %   Basos 1 Not Estab. %   Neutrophils Absolute 2.1 1.4 - 7.0 x10E3/uL  Lymphocytes Absolute 1.6 0.7 - 3.1 x10E3/uL   Monocytes Absolute 0.5 0.1 - 0.9 x10E3/uL   EOS (ABSOLUTE) 0.3 0.0 - 0.4 x10E3/uL   Basophils Absolute 0.1 0.0 - 0.2 x10E3/uL   Immature Granulocytes 0 Not Estab. %   Immature Grans (Abs) 0.0 0.0 - 0.1 x10E3/uL  Comprehensive metabolic panel     Status: Abnormal   Collection Time: 11/03/23 10:42 AM  Result Value Ref Range   Glucose 89 70 - 99 mg/dL   BUN 11 6 - 24 mg/dL   Creatinine, Ser 8.69 (H) 0.76 - 1.27 mg/dL   eGFR 66 >40 fO/fpw/8.26   BUN/Creatinine Ratio 8 (L) 9 - 20   Sodium 138 134 - 144 mmol/L   Potassium 4.8 3.5 - 5.2 mmol/L   Chloride 99 96 - 106 mmol/L   CO2 28 20 - 29 mmol/L   Calcium  9.9 8.7 - 10.2 mg/dL   Total Protein 7.4 6.0 - 8.5 g/dL   Albumin 4.7 3.8 - 4.9 g/dL   Globulin, Total 2.7 1.5 - 4.5 g/dL   Bilirubin Total 0.4 0.0 - 1.2 mg/dL   Alkaline Phosphatase 106 44 - 121 IU/L   AST 21 0 - 40 IU/L   ALT 12 0 - 44 IU/L  TSH     Status: None   Collection Time: 11/03/23 10:42 AM  Result Value Ref Range   TSH 1.350 0.450 - 4.500 uIU/mL  H. pylori breath test     Status: None   Collection Time: 11/03/23 10:42 AM  Result Value Ref Range   H pylori Breath Test Negative Negative     PHQ2/9:    12/07/2023    2:12 PM 12/01/2023    9:21 AM 11/03/2023   10:08 AM 06/23/2023    8:38 AM 06/04/2023   10:50 AM  Depression screen PHQ 2/9  Decreased Interest 0 0 0 0 0  Down, Depressed, Hopeless 0 0 0 0 0  PHQ - 2 Score 0 0 0 0 0  Altered sleeping 0 1  0 0  Tired, decreased energy 0 0  0 0  Change in appetite 0 0  0 0  Feeling bad or failure about yourself  0 0  0 0  Trouble concentrating 0 0  0 0  Moving slowly or fidgety/restless 0 0  0 0  Suicidal thoughts 0 0  0 0   PHQ-9 Score 0 1  0 0  Difficult doing work/chores Not difficult at all Not difficult at all  Not difficult at all Not difficult at all      Fall Risk:    12/07/2023    2:16 PM 11/03/2023   10:08 AM 06/23/2023    8:35 AM 06/04/2023   10:50 AM 05/01/2023   10:20 AM  Fall Risk   Falls in the past year? 0 0 0 0 0  Number falls in past yr:  0 0 0 0  Injury with Fall?  0 0 0 0  Risk for fall due to : No Fall Risks No Fall Risks  No Fall Risks   Follow up Falls prevention discussed;Education provided;Falls evaluation completed   Falls prevention discussed;Education provided;Falls evaluation completed       Functional Status Survey:      Assessment & Plan  Problem List Items Addressed This Visit       Cardiovascular and Mediastinum   Essential hypertension - Primary   Chronic, historic condition Appears to be be controlled on current regimen comprised of Lisinopril  40  mg PO every day, Amlodipine  5 mg PO every day, and Nebivolol  2.5 mg PO every day Continue current regimen Recommend monitoring BP at home for accurate surveillance  Follow up in 6 months or sooner if concerns arise        Relevant Medications   atorvastatin  (LIPITOR) 10 MG tablet   lisinopril  (ZESTRIL ) 40 MG tablet   nebivolol  (BYSTOLIC ) 2.5 MG tablet     Respiratory   Reactive airway disease with wheezing   Acute, ongoing, newly reported Patient reports over the last few months he has had coughing at night when he lays down accompanied by foamy phlegm He initially thought this was due to reflux but denies heartburn symptoms.  Euvolemic, no extra heart sounds, murmur or rubs Suspect coughing symptoms are likely secondary airway disease etiology based on HPI- will try adding Albuterol  inhaler to assist with inflammation Has lung cancer screening today.  Recommend he engages more consistently with smoking cessation efforts  Follow up in 3 months or sooner if concerns arise        Relevant Medications    albuterol  (VENTOLIN  HFA) 108 (90 Base) MCG/ACT inhaler     Digestive   Gastroesophageal reflux disease without esophagitis   Chronic, historic condition Appears to be controlled with current regimen  Taking Pepcid  20 mg PO BID and Protonix  40 mg PO every day and appears to be tolerating well  Continue current regimen Avoid trigger foods and alcohol Follow up in 6 months or sooner if concerns arise        Relevant Medications   pantoprazole  (PROTONIX ) 40 MG tablet     Other   Hyperlipidemia   Due for statin refills, Refills provided Recheck lipid panel at follow up for monitoring       Relevant Medications   atorvastatin  (LIPITOR) 10 MG tablet   lisinopril  (ZESTRIL ) 40 MG tablet   nebivolol  (BYSTOLIC ) 2.5 MG tablet     Return in about 3 months (around 03/28/2024) for HTN, HLD, prediabetes, breathing concerns .   I, Iara Monds E Vincenta Steffey, PA-C, have reviewed all documentation for this visit. The documentation on 12/29/23 for the exam, diagnosis, procedures, and orders are all accurate and complete.   Rock[place] Mt, MHS, PA-C [facility]

## 2023-12-29 NOTE — Assessment & Plan Note (Addendum)
 Acute, ongoing, newly reported Patient reports over the last few months he has had coughing at night when he lays down accompanied by foamy phlegm He initially thought this was due to reflux but denies heartburn symptoms.  Euvolemic, no extra heart sounds, murmur or rubs Suspect coughing symptoms are likely secondary airway disease etiology based on HPI- will try adding Albuterol  inhaler to assist with inflammation Has lung cancer screening today.  Recommend he engages more consistently with smoking cessation efforts  Follow up in 3 months or sooner if concerns arise

## 2023-12-29 NOTE — Assessment & Plan Note (Signed)
 Chronic, historic condition Appears to be be controlled on current regimen comprised of Lisinopril  40 mg PO every day, Amlodipine  5 mg PO every day, and Nebivolol  2.5 mg PO every day Continue current regimen Recommend monitoring BP at home for accurate surveillance  Follow up in 6 months or sooner if concerns arise

## 2024-01-06 DIAGNOSIS — J189 Pneumonia, unspecified organism: Secondary | ICD-10-CM | POA: Diagnosis not present

## 2024-01-06 DIAGNOSIS — R059 Cough, unspecified: Secondary | ICD-10-CM | POA: Diagnosis not present

## 2024-01-07 ENCOUNTER — Other Ambulatory Visit: Payer: Self-pay

## 2024-01-07 DIAGNOSIS — Z122 Encounter for screening for malignant neoplasm of respiratory organs: Secondary | ICD-10-CM

## 2024-01-07 DIAGNOSIS — F17210 Nicotine dependence, cigarettes, uncomplicated: Secondary | ICD-10-CM

## 2024-01-07 DIAGNOSIS — Z87891 Personal history of nicotine dependence: Secondary | ICD-10-CM

## 2024-01-10 DIAGNOSIS — J069 Acute upper respiratory infection, unspecified: Secondary | ICD-10-CM | POA: Diagnosis not present

## 2024-01-13 ENCOUNTER — Emergency Department: Payer: BC Managed Care – PPO

## 2024-01-13 ENCOUNTER — Encounter: Payer: Self-pay | Admitting: *Deleted

## 2024-01-13 ENCOUNTER — Emergency Department
Admission: EM | Admit: 2024-01-13 | Discharge: 2024-01-13 | Disposition: A | Discharge status: Home / Self Care | Payer: BC Managed Care – PPO | Attending: Emergency Medicine | Admitting: Emergency Medicine

## 2024-01-13 ENCOUNTER — Other Ambulatory Visit: Payer: Self-pay

## 2024-01-13 DIAGNOSIS — R059 Cough, unspecified: Secondary | ICD-10-CM | POA: Diagnosis not present

## 2024-01-13 DIAGNOSIS — Z20822 Contact with and (suspected) exposure to covid-19: Secondary | ICD-10-CM | POA: Diagnosis not present

## 2024-01-13 DIAGNOSIS — R079 Chest pain, unspecified: Secondary | ICD-10-CM | POA: Diagnosis not present

## 2024-01-13 DIAGNOSIS — J101 Influenza due to other identified influenza virus with other respiratory manifestations: Secondary | ICD-10-CM | POA: Insufficient documentation

## 2024-01-13 DIAGNOSIS — R509 Fever, unspecified: Secondary | ICD-10-CM | POA: Diagnosis not present

## 2024-01-13 LAB — CBC WITH DIFFERENTIAL/PLATELET
Abs Immature Granulocytes: 0.03 10*3/uL (ref 0.00–0.07)
Basophils Absolute: 0.1 10*3/uL (ref 0.0–0.1)
Basophils Relative: 1 %
Eosinophils Absolute: 0.6 10*3/uL — ABNORMAL HIGH (ref 0.0–0.5)
Eosinophils Relative: 9 %
HCT: 42.7 % (ref 39.0–52.0)
Hemoglobin: 14.4 g/dL (ref 13.0–17.0)
Immature Granulocytes: 0 %
Lymphocytes Relative: 6 %
Lymphs Abs: 0.4 10*3/uL — ABNORMAL LOW (ref 0.7–4.0)
MCH: 30.9 pg (ref 26.0–34.0)
MCHC: 33.7 g/dL (ref 30.0–36.0)
MCV: 91.6 fL (ref 80.0–100.0)
Monocytes Absolute: 0.8 10*3/uL (ref 0.1–1.0)
Monocytes Relative: 12 %
Neutro Abs: 4.9 10*3/uL (ref 1.7–7.7)
Neutrophils Relative %: 72 %
Platelets: 200 10*3/uL (ref 150–400)
RBC: 4.66 MIL/uL (ref 4.22–5.81)
RDW: 12.3 % (ref 11.5–15.5)
WBC: 6.9 10*3/uL (ref 4.0–10.5)
nRBC: 0 % (ref 0.0–0.2)

## 2024-01-13 LAB — BASIC METABOLIC PANEL
Anion gap: 10 (ref 5–15)
BUN: 8 mg/dL (ref 6–20)
CO2: 28 mmol/L (ref 22–32)
Calcium: 9.1 mg/dL (ref 8.9–10.3)
Chloride: 98 mmol/L (ref 98–111)
Creatinine, Ser: 1.19 mg/dL (ref 0.61–1.24)
GFR, Estimated: >60 mL/min (ref >60)
Glucose, Bld: 80 mg/dL (ref 70–99)
Potassium: 4 mmol/L (ref 3.5–5.1)
Sodium: 136 mmol/L (ref 135–145)

## 2024-01-13 LAB — RESP PANEL BY RT-PCR (RSV, FLU A&B, COVID)  RVPGX2
Influenza A by PCR: POSITIVE — AB
Influenza B by PCR: NEGATIVE
Resp Syncytial Virus by PCR: NEGATIVE
SARS Coronavirus 2 by RT PCR: NEGATIVE

## 2024-01-13 MED ORDER — VENTOLIN HFA 108 (90 BASE) MCG/ACT IN AERS: 1.0000 | INHALATION_SPRAY | RESPIRATORY_TRACT | 1 refills | Status: DC | PRN

## 2024-01-13 MED ORDER — BENZONATATE 100 MG PO CAPS: ORAL_CAPSULE | ORAL | 0 refills | Status: DC

## 2024-01-13 MED ORDER — PSEUDOEPH-BROMPHEN-DM 30-2-10 MG/5ML PO SYRP: 10.0000 mL | ORAL_SOLUTION | Freq: Four times a day (QID) | ORAL | 0 refills | Status: DC | PRN

## 2024-01-13 MED ORDER — PREDNISONE 20 MG PO TABS: 40.0000 mg | ORAL_TABLET | Freq: Every day | ORAL | 0 refills | Status: AC

## 2024-01-13 MED ORDER — ONDANSETRON 4 MG PO TBDP: 4.0000 mg | ORAL_TABLET | Freq: Three times a day (TID) | ORAL | 0 refills | Status: DC | PRN

## 2024-01-13 NOTE — ED Provider Triage Note (Signed)
Emergency Medicine Provider Triage Evaluation Note  Calvin Taylor , a 53 y.o. male  was evaluated in triage.  Pt complains of cough for about 3 months, stopped lisinopril 2 to 3 weeks ago thinking this may be causing the cough.  Is been seen at his doctor, [facility].  Continues to have cough and ribs are hurting from coughing..  Review of Systems  Positive:  Negative:   Physical Exam  BP (!) 172/109 (BP Location: Left Arm) Comment: Pt stated he has not taken BP meds today  Pulse 87   Temp 99.3 F (37.4 C) (Oral)   Resp 17   SpO2 97%  Gen:   Awake, no distress   Resp:  Normal effort  MSK:   Moves extremities without difficulty  Other:    Medical Decision Making  Medically screening exam initiated at 2:12 PM.  Appropriate orders placed.  Alireza Vigorito was informed that the remainder of the evaluation will be completed by another provider, this initial triage assessment does not replace that evaluation, and the importance of remaining in the ED until their evaluation is complete.     Faythe Ghee, PA-C 01/13/24 [phone]

## 2024-01-13 NOTE — Discharge Instructions (Addendum)
Your exam, labs, and chest x-ray overall reassuring at this time.  Your viral panel test confirms influenza.  No evidence of pneumonia or bronchitis on your chest x-ray.  Take prescription meds as directed.  Rest and hydrate to prevent dehydration.  Follow-up with primary provider for ongoing concerns.  Return to the ED as needed.

## 2024-01-13 NOTE — ED Triage Notes (Signed)
Cough x3 months.  Pt has been seen for this several times.

## 2024-01-14 ENCOUNTER — Telehealth: Payer: Self-pay | Admitting: Family Medicine

## 2024-01-14 NOTE — Telephone Encounter (Signed)
Patient was seen in the hospital 01/13/2024 and would like a Dr. Note stating he can return back to work full duty on Monday 01/18/2024 with no restrictions. Caller would like to pick up Dr, Note, please call when ready.

## 2024-01-15 NOTE — Telephone Encounter (Signed)
Called patient and made aware. Patient verbalized understanding.

## 2024-01-18 ENCOUNTER — Ambulatory Visit: Payer: BC Managed Care – PPO | Admitting: Family Medicine

## 2024-02-05 DIAGNOSIS — J189 Pneumonia, unspecified organism: Secondary | ICD-10-CM | POA: Diagnosis not present

## 2024-02-05 DIAGNOSIS — R053 Chronic cough: Secondary | ICD-10-CM | POA: Diagnosis not present

## 2024-02-05 DIAGNOSIS — I10 Essential (primary) hypertension: Secondary | ICD-10-CM | POA: Diagnosis not present

## 2024-02-16 DIAGNOSIS — J209 Acute bronchitis, unspecified: Secondary | ICD-10-CM | POA: Diagnosis not present

## 2024-02-16 DIAGNOSIS — R0602 Shortness of breath: Secondary | ICD-10-CM | POA: Diagnosis not present

## 2024-03-25 ENCOUNTER — Other Ambulatory Visit: Payer: Self-pay | Admitting: Physician Assistant

## 2024-03-25 DIAGNOSIS — K219 Gastro-esophageal reflux disease without esophagitis: Secondary | ICD-10-CM

## 2024-03-25 DIAGNOSIS — E785 Hyperlipidemia, unspecified: Secondary | ICD-10-CM

## 2024-03-25 DIAGNOSIS — I10 Essential (primary) hypertension: Secondary | ICD-10-CM

## 2024-03-25 NOTE — Telephone Encounter (Signed)
 Requested Prescriptions  Pending Prescriptions Disp Refills   atorvastatin (LIPITOR) 10 MG tablet [Pharmacy Med Name: ATORVASTATIN 10MG  TABLETS] 90 tablet 0    Sig: TAKE 1 TABLET(10 MG) BY MOUTH DAILY     Cardiovascular:  Antilipid - Statins Failed - 03/25/2024  5:03 PM      Failed - Valid encounter within last 12 months    Recent Outpatient Visits   None     Future Appointments             In 3 days Calvin Berry, PA-C [facility]            Failed - Lipid Panel in normal range within the last 12 months    Cholesterol, Total  Date Value Ref Range Status  11/03/2023 229 (H) 100 - 199 mg/dL Final   LDL Cholesterol (Calc)  Date Value Ref Range Status  03/06/2023 122 (H) mg/dL (calc) Final    Comment:    Reference range: <100 . Desirable range <100 mg/dL for primary prevention;   <70 mg/dL for patients with CHD or diabetic patients  with > or = 2 CHD risk factors. [name] Kitchen LDL-C is now calculated using the Martin-Hopkins  calculation, which is a validated novel method providing  better accuracy than the Friedewald equation in the  estimation of LDL-C.  Horald Pollen et al. Lenox Ahr. 1610;960(45): 2061-2068  (http://education.[employer].com/faq/FAQ164)    LDL Chol Calc (NIH)  Date Value Ref Range Status  11/03/2023 129 (H) 0 - 99 mg/dL Final   HDL  Date Value Ref Range Status  11/03/2023 85 >39 mg/dL Final   Triglycerides  Date Value Ref Range Status  11/03/2023 87 0 - 149 mg/dL Final         Passed - Patient is not pregnant       pantoprazole (PROTONIX) 40 MG tablet [Pharmacy Med Name: PANTOPRAZOLE 40MG  TABLETS] 90 tablet 0    Sig: TAKE 1 TABLET(40 MG) BY MOUTH DAILY     Gastroenterology: Proton Pump Inhibitors Failed - 03/25/2024  5:03 PM      Failed - Valid encounter within last 12 months    Recent Outpatient Visits   None     Future Appointments             In 3 days Calvin Berry, PA-C [place] [facility]             lisinopril (ZESTRIL) 40 MG tablet [Pharmacy Med Name: LISINOPRIL 40MG  TABLETS] 90 tablet 0    Sig: TAKE 1 TABLET(40 MG) BY MOUTH DAILY     Cardiovascular:  ACE Inhibitors Failed - 03/25/2024  5:03 PM      Failed - Last BP in normal range    BP Readings from Last 1 Encounters:  01/13/24 (!) 172/109         Failed - Valid encounter within last 6 months    Recent Outpatient Visits   None     Future Appointments             In 3 days Calvin Berry, PA-C [facility]            Passed - Cr in normal range and within 180 days    Creat  Date Value Ref Range Status  03/06/2023 1.16 0.70 - 1.30 mg/dL Final   Creatinine, Ser  Date Value Ref Range Status  01/13/2024 1.19 0.61 - 1.24 mg/dL Final  Passed - K in normal range and within 180 days    Potassium  Date Value Ref Range Status  01/13/2024 4.0 3.5 - 5.1 mmol/L Final         Passed - Patient is not pregnant

## 2024-03-28 ENCOUNTER — Encounter: Payer: Self-pay | Admitting: Family Medicine

## 2024-03-28 ENCOUNTER — Ambulatory Visit: Payer: Self-pay | Admitting: Family Medicine

## 2024-03-28 ENCOUNTER — Telehealth: Payer: Self-pay

## 2024-03-28 VITALS — BP 132/78 | HR 70 | Resp 16 | Ht 69.0 in | Wt 164.0 lb

## 2024-03-28 DIAGNOSIS — K219 Gastro-esophageal reflux disease without esophagitis: Secondary | ICD-10-CM

## 2024-03-28 DIAGNOSIS — Z23 Encounter for immunization: Secondary | ICD-10-CM

## 2024-03-28 DIAGNOSIS — I10 Essential (primary) hypertension: Secondary | ICD-10-CM

## 2024-03-28 DIAGNOSIS — E785 Hyperlipidemia, unspecified: Secondary | ICD-10-CM | POA: Diagnosis not present

## 2024-03-28 DIAGNOSIS — J4520 Mild intermittent asthma, uncomplicated: Secondary | ICD-10-CM

## 2024-03-28 DIAGNOSIS — J302 Other seasonal allergic rhinitis: Secondary | ICD-10-CM | POA: Insufficient documentation

## 2024-03-28 DIAGNOSIS — F17210 Nicotine dependence, cigarettes, uncomplicated: Secondary | ICD-10-CM

## 2024-03-28 DIAGNOSIS — R053 Chronic cough: Secondary | ICD-10-CM

## 2024-03-28 DIAGNOSIS — Z87891 Personal history of nicotine dependence: Secondary | ICD-10-CM

## 2024-03-28 DIAGNOSIS — J310 Chronic rhinitis: Secondary | ICD-10-CM

## 2024-03-28 MED ORDER — LEVOCETIRIZINE DIHYDROCHLORIDE 5 MG PO TABS
5.0000 mg | ORAL_TABLET | Freq: Every evening | ORAL | 1 refills | Status: DC
Start: 2024-03-28 — End: 2024-09-16

## 2024-03-28 MED ORDER — IPRATROPIUM BROMIDE 0.03 % NA SOLN: 2.0000 | Freq: Two times a day (BID) | NASAL | 12 refills | Status: AC

## 2024-03-28 MED ORDER — PANTOPRAZOLE SODIUM 40 MG PO TBEC: 40.0000 mg | DELAYED_RELEASE_TABLET | Freq: Two times a day (BID) | ORAL | 0 refills | Status: DC

## 2024-03-28 MED ORDER — AMLODIPINE BESYLATE-VALSARTAN 5-320 MG PO TABS: 1.0000 | ORAL_TABLET | Freq: Every day | ORAL | 1 refills | Status: DC

## 2024-03-28 MED ORDER — ROSUVASTATIN CALCIUM 10 MG PO TABS
10.0000 mg | ORAL_TABLET | Freq: Every day | ORAL | 3 refills | Status: AC
Start: 2024-03-28 — End: ?

## 2024-03-28 MED ORDER — FAMOTIDINE 20 MG PO TABS
20.0000 mg | ORAL_TABLET | Freq: Two times a day (BID) | ORAL | 1 refills | Status: DC | PRN
Start: 2024-03-28 — End: 2024-09-16

## 2024-03-28 MED ORDER — BREZTRI AEROSPHERE 160-9-4.8 MCG/ACT IN AERO: 2.0000 | INHALATION_SPRAY | Freq: Two times a day (BID) | RESPIRATORY_TRACT | 11 refills | Status: DC

## 2024-03-28 NOTE — Telephone Encounter (Signed)
 Copied from CRM [id number]. Topic: Clinical - Prescription Issue >> Mar 28, 2024  2:44 PM Bridgette Campus T wrote: Reason for CRM: budeson-glycopyrrolate-formoterol (BREZTRI AEROSPHERE) 160-9-4.8 MCG/ACT AERO inhaler- wants to stay with old inhaler due to having to rinse mouth out after using the new one- wants to stay with VENTOLIN HFA 108 (90 Base) MCG/ACT inhaler  - please refill - any questions please call patient  [phone]

## 2024-03-28 NOTE — Patient Instructions (Signed)
 Cough -     Address possible GERD aspect - double up on pantoprazole (40 mg BID) and use pepcid BID PRN \ Start allergy meds nightly - xyzal or other over the counter allergy med.  I will try and find what nose spray also helped Start daily inhaler and continue to use albuterol rescue inhaler as needed for coughing fits or shortness of breath or wheeze I will send in a replacement blood pressure pill to replace lisinopril as that can sometimes cause cough  You will follow up with pulmonary for further evaluation and testing

## 2024-03-28 NOTE — Assessment & Plan Note (Signed)
 Recently stopped smoking but is having more URI and coughing for 3-4 months See "cough" below for plan

## 2024-03-28 NOTE — Assessment & Plan Note (Signed)
 Labs elevated, poor compliance Switch med to crestor - can take daily in am with other meds, recheck labs in ~4 months

## 2024-03-28 NOTE — Assessment & Plan Note (Signed)
 very red/swollen nasal mucosa and OP, not on meds right now and symptomatic, encouraged restarting antihistamine and found nose spray in chart he says was effective Steroid nose spray may also be helpful

## 2024-03-28 NOTE — Progress Notes (Signed)
 Name: Calvin Taylor   MRN: [id number]    DOB: September 04, 1971   Date:03/28/2024       Progress Note  Chief Complaint  Patient presents with   Medical Management of Chronic Issues     Subjective:   Calvin Taylor is a 53 y.o. male, presents to clinic for routine follow up on chronic conditions  Here for routine f/up and med refills He did CPE about 4 months ago and routine f/up 3 months ago with different providers in office.  HTN - BP is near goala nd has been well controlled with norvast lisinopril and nebivolol BP Readings from Last 3 Encounters:  03/28/24 132/78  01/13/24 (!) 172/109  12/29/23 136/88   Pulse Readings from Last 3 Encounters:  03/28/24 70  01/13/24 87  12/29/23 87   Hyperlipidemia: Currently treated with atorvastatin 10 mg for more than a year, pt reports poor med compliance Last Lipids: Lab Results  Component Value Date   CHOL 229 (H) 11/03/2023   HDL 85 11/03/2023   LDLCALC 129 (H) 11/03/2023   TRIG 87 11/03/2023   CHOLHDL 2.7 11/03/2023   - Denies: Chest pain, shortness of breath, myalgias, claudication  Deficiencies - thiamine and b12 Lab Results  Component Value Date   VITAMINB12 386 06/23/2023   Allergies?  Not on meds right now, having sneezing drainage, coughing  GERD on pepcid prn -  and protonix 40 mg once a day  Concerned with chronic coughing for months, feels congestions/guggling in throat, sensation of something in throat, but no difficulty swallowing or choking He's been on GERD meds, stopped smoking, used OTC and prescribed cough meds, inhalers,   Stopped smoking earlier this year  He even held his lisinopril for a while which may have helped the cough  He was given a nasal spray and that seemed to help his cough as well   Hx of reactive airway hx on chart - inhaler albuterol sometimes cough is associated with tightness, SOB wheeze    Current Outpatient Medications:    amLODipine-valsartan (EXFORGE) 5-320 MG tablet, Take 1  tablet by mouth daily., Disp: 90 tablet, Rfl: 1   blood glucose meter kit and supplies KIT, Dispense based on patient and insurance preference. Use up to four times daily as directed. (FOR ICD-10 E 16.2), Disp: 1 each, Rfl: 0   budeson-glycopyrrolate-formoterol (BREZTRI AEROSPHERE) 160-9-4.8 MCG/ACT AERO inhaler, Inhale 2 puffs into the lungs 2 (two) times daily., Disp: 10.7 g, Rfl: 11   ipratropium (ATROVENT) 0.03 % nasal spray, Place 2 sprays into both nostrils every 12 (twelve) hours., Disp: 30 mL, Rfl: 12   levocetirizine (XYZAL) 5 MG tablet, Take 1 tablet (5 mg total) by mouth every evening., Disp: 90 tablet, Rfl: 1   nebivolol (BYSTOLIC) 2.5 MG tablet, Take 1 tablet (2.5 mg total) by mouth daily., Disp: 90 tablet, Rfl: 0   ondansetron (ZOFRAN-ODT) 4 MG disintegrating tablet, Take 1 tablet (4 mg total) by mouth every 8 (eight) hours as needed for nausea or vomiting., Disp: 15 tablet, Rfl: 0   rosuvastatin (CRESTOR) 10 MG tablet, Take 1 tablet (10 mg total) by mouth daily., Disp: 90 tablet, Rfl: 3   tadalafil (CIALIS) 20 MG tablet, TAKE 1 TABLET(20 MG) BY MOUTH EVERY OTHER DAY AS NEEDED FOR ERECTILE DYSFUNCTION, Disp: 10 tablet, Rfl: 1   thiamine (VITAMIN B1) 100 MG tablet, Take 1 tablet (100 mg total) by mouth daily., Disp: 90 tablet, Rfl: 1   tiZANidine (ZANAFLEX) 4 MG tablet, TAKE 1 TABLET(4  MG) BY MOUTH AT BEDTIME AS NEEDED FOR MUSCLE SPASMS, Disp: 30 tablet, Rfl: 0   VENTOLIN HFA 108 (90 Base) MCG/ACT inhaler, Inhale 1-2 puffs into the lungs every 4 (four) hours as needed for wheezing or shortness of breath., Disp: 6.7 g, Rfl: 1   famotidine (PEPCID) 20 MG tablet, Take 1 tablet (20 mg total) by mouth 2 (two) times daily as needed for heartburn or indigestion., Disp: 180 tablet, Rfl: 1   pantoprazole (PROTONIX) 40 MG tablet, Take 1 tablet (40 mg total) by mouth 2 (two) times daily., Disp: 60 tablet, Rfl: 0  Patient Active Problem List   Diagnosis Date Noted   Seasonal allergies 03/28/2024    Reactive airway disease with wheezing 12/29/2023   Smoking 11/03/2023   Smokes less than 1 pack a day with greater than 30 pack year history 03/25/2021   Hyperlipidemia 03/25/2021   Erectile dysfunction 03/12/2020   Current smoker 03/12/2020   Medial epicondylitis of right elbow 07/26/2019   Gastroesophageal reflux disease without esophagitis 04/21/2019   Cyst, dermoid, scalp and neck 04/21/2019   Alcohol abuse 04/21/2019   Sebaceous cyst 03/16/2019   Borderline diabetes mellitus 04/30/2018   Essential hypertension 04/28/2018    Past Surgical History:  Procedure Laterality Date   COLONOSCOPY WITH PROPOFOL N/A 04/29/2021   Procedure: COLONOSCOPY WITH PROPOFOL;  Surgeon: Toney Reil, MD;  Location: [facility];  Service: Gastroenterology;  Laterality: N/A;   right eye surgery      Family History  Problem Relation Age of Onset   Hypertension Mother    Hypertension Father     Social History   Tobacco Use   Smoking status: Every Day    Current packs/day: 1.00    Average packs/day: 1 pack/day for 33.0 years (33.0 ttl pk-yrs)    Types: Cigarettes   Smokeless tobacco: Never   Tobacco comments:    Smoked 2ppd for 26 years, started smoking 53 y/o, 56 pack year hx  Vaping Use   Vaping status: Never Used  Substance Use Topics   Alcohol use: Yes    Alcohol/week: 32.0 standard drinks of alcohol    Types: 30 Cans of beer, 2 Shots of liquor per week    Comment: per day beers 3 weekdays 6 weekends   Drug use: No     No Known Allergies  Health Maintenance  Topic Date Due   COVID-19 Vaccine (2 - Pfizer risk series) 04/13/2024 (Originally 02/04/2021)   Zoster Vaccines- Shingrix (1 of 2) 06/27/2024 (Originally 02/06/1990)   INFLUENZA VACCINE  07/15/2024   Lung Cancer Screening  12/28/2024   Colonoscopy  04/29/2026   DTaP/Tdap/Td (8 - Td or Tdap) 03/19/2031   Pneumococcal Vaccine 43-72 Years old  Completed   Hepatitis C Screening  Completed   HIV Screening  Completed    HPV VACCINES  Aged Out   Meningococcal B Vaccine  Aged Out    Chart Review Today: I personally reviewed active problem list, medication list, allergies, family history, social history, health maintenance, notes from last encounter, lab results, imaging with the patient/caregiver today.   Review of Systems  Constitutional: Negative.   HENT: Negative.    Eyes: Negative.   Respiratory: Negative.    Cardiovascular: Negative.   Gastrointestinal: Negative.   Endocrine: Negative.   Genitourinary: Negative.   Musculoskeletal: Negative.   Skin: Negative.   Allergic/Immunologic: Negative.   Neurological: Negative.   Hematological: Negative.   Psychiatric/Behavioral: Negative.    All other systems reviewed and are negative.  Objective:   Vitals:   03/28/24 0942  BP: 132/78  Pulse: 70  Resp: 16  SpO2: 98%  Weight: 164 lb (74.4 kg)  Height: 5\' 9"  (1.753 m)    Body mass index is 24.22 kg/m.  Physical Exam Vitals and nursing note reviewed.  Constitutional:      General: He is not in acute distress.    Appearance: Normal appearance. He is well-developed. He is not ill-appearing, toxic-appearing or diaphoretic.  HENT:     Head: Normocephalic and atraumatic.     Jaw: No trismus.     Right Ear: External ear normal.     Left Ear: External ear normal.     Nose: Mucosal edema, congestion and rhinorrhea present.     Right Sinus: No maxillary sinus tenderness or frontal sinus tenderness.     Left Sinus: No maxillary sinus tenderness or frontal sinus tenderness.     Mouth/Throat:     Mouth: Mucous membranes are moist. Mucous membranes are not pale, not dry and not cyanotic.     Pharynx: Uvula midline. Posterior oropharyngeal erythema present. No oropharyngeal exudate or uvula swelling.     Tonsils: No tonsillar exudate or tonsillar abscesses.  Eyes:     General: Lids are normal. No scleral icterus.       Right eye: No discharge.        Left eye: No discharge.      Conjunctiva/sclera: Conjunctivae normal.  Neck:     Trachea: Trachea and phonation normal. No tracheal deviation.  Cardiovascular:     Rate and Rhythm: Normal rate and regular rhythm.     Pulses:          Radial pulses are 2+ on the right side and 2+ on the left side.     Heart sounds: Normal heart sounds. No murmur heard.    No friction rub. No gallop.  Pulmonary:     Effort: Pulmonary effort is normal. No tachypnea, accessory muscle usage or respiratory distress.     Breath sounds: Normal breath sounds. No stridor. No decreased breath sounds, wheezing, rhonchi or rales.  Abdominal:     General: Bowel sounds are normal. There is no distension.     Palpations: Abdomen is soft.     Tenderness: There is no abdominal tenderness.  Musculoskeletal:        General: Normal range of motion.     Cervical back: Normal range of motion and neck supple.  Skin:    General: Skin is warm and dry.     Capillary Refill: Capillary refill takes less than 2 seconds.     Coloration: Skin is not pale.     Findings: No rash.     Nails: There is no clubbing.  Neurological:     Mental Status: He is alert and oriented to person, place, and time.     Motor: No abnormal muscle tone.     Coordination: Coordination normal.     Gait: Gait normal.  Psychiatric:        Speech: Speech normal.        Behavior: Behavior normal. Behavior is cooperative.      Functional Status Survey:   Results for orders placed or performed during the hospital encounter of 01/13/24  Resp panel by RT-PCR (RSV, Flu A&B, Covid) Anterior Nasal Swab   Collection Time: 01/13/24  2:08 PM   Specimen: Anterior Nasal Swab  Result Value Ref Range   SARS Coronavirus 2 by RT PCR NEGATIVE NEGATIVE  Influenza A by PCR POSITIVE (A) NEGATIVE   Influenza B by PCR NEGATIVE NEGATIVE   Resp Syncytial Virus by PCR NEGATIVE NEGATIVE  Basic metabolic panel   Collection Time: 01/13/24  2:09 PM  Result Value Ref Range   Sodium 136 135 - 145  mmol/L   Potassium 4.0 3.5 - 5.1 mmol/L   Chloride 98 98 - 111 mmol/L   CO2 28 22 - 32 mmol/L   Glucose, Bld 80 70 - 99 mg/dL   BUN 8 6 - 20 mg/dL   Creatinine, Ser 1.61 0.61 - 1.24 mg/dL   Calcium 9.1 8.9 - 09.6 mg/dL   GFR, Estimated >04 >54 mL/min   Anion gap 10 5 - 15  CBC with Differential   Collection Time: 01/13/24  2:09 PM  Result Value Ref Range   WBC 6.9 4.0 - 10.5 K/uL   RBC 4.66 4.22 - 5.81 MIL/uL   Hemoglobin 14.4 13.0 - 17.0 g/dL   HCT 09.8 11.9 - 14.7 %   MCV 91.6 80.0 - 100.0 fL   MCH 30.9 26.0 - 34.0 pg   MCHC 33.7 30.0 - 36.0 g/dL   RDW 82.9 56.2 - 13.0 %   Platelets 200 150 - 400 K/uL   nRBC 0.0 0.0 - 0.2 %   Neutrophils Relative % 72 %   Neutro Abs 4.9 1.7 - 7.7 K/uL   Lymphocytes Relative 6 %   Lymphs Abs 0.4 (L) 0.7 - 4.0 K/uL   Monocytes Relative 12 %   Monocytes Absolute 0.8 0.1 - 1.0 K/uL   Eosinophils Relative 9 %   Eosinophils Absolute 0.6 (H) 0.0 - 0.5 K/uL   Basophils Relative 1 %   Basophils Absolute 0.1 0.0 - 0.1 K/uL   Immature Granulocytes 0 %   Abs Immature Granulocytes 0.03 0.00 - 0.07 K/uL      Assessment & Plan:   Problem List Items Addressed This Visit     Gastroesophageal reflux disease without esophagitis   A lot of sx reported today, reflux, belching, congestion in throat and coughing Increase PPI to BID for 2-4 weeks Can use pepcid PRN BID      Relevant Medications   famotidine (PEPCID) 20 MG tablet   pantoprazole (PROTONIX) 40 MG tablet   Other Relevant Orders   Ambulatory referral to Pulmonology   Essential hypertension - Primary   Fairly well controlled today but with coughing will change lisinopril to alternative med BP Readings from Last 3 Encounters:  03/28/24 132/78  01/13/24 (!) 172/109  12/29/23 136/88        Relevant Medications   rosuvastatin (CRESTOR) 10 MG tablet   amLODipine-valsartan (EXFORGE) 5-320 MG tablet   Smokes less than 1 pack a day with greater than 30 pack year history   Recently  stopped smoking but is having more URI and coughing for 3-4 months See "cough" below for plan      Relevant Orders   Ambulatory referral to Pulmonology   Hyperlipidemia   Labs elevated, poor compliance Switch med to crestor - can take daily in am with other meds, recheck labs in ~4 months      Relevant Medications   rosuvastatin (CRESTOR) 10 MG tablet   amLODipine-valsartan (EXFORGE) 5-320 MG tablet   Reactive airway disease with wheezing   Hx of - coughing chronically and worsening, using SABA a lot Trial of maintenance inhaler and increasing control and tx of GERD and allergies      Relevant Medications   budeson-glycopyrrolate-formoterol (BREZTRI  AEROSPHERE) 160-9-4.8 MCG/ACT AERO inhaler   Other Relevant Orders   Ambulatory referral to Pulmonology   Seasonal allergies   very red/swollen nasal mucosa and OP, not on meds right now and symptomatic, encouraged restarting antihistamine and found nose spray in chart he says was effective Steroid nose spray may also be helpful      Relevant Medications   levocetirizine (XYZAL) 5 MG tablet   ipratropium (ATROVENT) 0.03 % nasal spray   Other Relevant Orders   Ambulatory referral to Pulmonology   Other Visit Diagnoses       Chronic cough       working on GI, ENT and pulm aspects as well as d/c lisinopril and replace with other med, f/up pulm   Relevant Medications   budeson-glycopyrrolate-formoterol (BREZTRI AEROSPHERE) 160-9-4.8 MCG/ACT AERO inhaler   Other Relevant Orders   Ambulatory referral to Pulmonology     Former smoker       Relevant Orders   Ambulatory referral to Pulmonology     Immunization due       Relevant Orders   Pneumococcal conjugate vaccine 20-valent (Prevnar 20) (Completed)     Chronic rhinitis       Relevant Medications   levocetirizine (XYZAL) 5 MG tablet   ipratropium (ATROVENT) 0.03 % nasal spray   Other Relevant Orders   Ambulatory referral to Pulmonology         Return for 4-6 month  routine f/up/labs cholesterol med change.   Adeline Hone, PA-C 03/28/24 10:36 AM

## 2024-03-28 NOTE — Assessment & Plan Note (Signed)
 Fairly well controlled today but with coughing will change lisinopril to alternative med BP Readings from Last 3 Encounters:  03/28/24 132/78  01/13/24 (!) 172/109  12/29/23 136/88

## 2024-03-28 NOTE — Assessment & Plan Note (Signed)
 A lot of sx reported today, reflux, belching, congestion in throat and coughing Increase PPI to BID for 2-4 weeks Can use pepcid PRN BID

## 2024-03-28 NOTE — Assessment & Plan Note (Signed)
 Hx of - coughing chronically and worsening, using SABA a lot Trial of maintenance inhaler and increasing control and tx of GERD and allergies

## 2024-03-29 NOTE — Telephone Encounter (Signed)
 Called patient and made aware. Patient agreed and gave verbal understanding.

## 2024-04-01 ENCOUNTER — Encounter: Payer: Self-pay | Admitting: Family Medicine

## 2024-05-06 ENCOUNTER — Encounter: Payer: Self-pay | Admitting: Pulmonary Disease

## 2024-05-06 ENCOUNTER — Ambulatory Visit: Admitting: Pulmonary Disease

## 2024-05-06 VITALS — BP 160/100 | HR 64 | Temp 97.9°F | Ht 69.0 in | Wt 168.8 lb

## 2024-05-06 DIAGNOSIS — J4540 Moderate persistent asthma, uncomplicated: Secondary | ICD-10-CM | POA: Diagnosis not present

## 2024-05-06 DIAGNOSIS — I10 Essential (primary) hypertension: Secondary | ICD-10-CM

## 2024-05-06 DIAGNOSIS — R0602 Shortness of breath: Secondary | ICD-10-CM

## 2024-05-06 DIAGNOSIS — Z87891 Personal history of nicotine dependence: Secondary | ICD-10-CM

## 2024-05-06 DIAGNOSIS — K219 Gastro-esophageal reflux disease without esophagitis: Secondary | ICD-10-CM

## 2024-05-06 DIAGNOSIS — J439 Emphysema, unspecified: Secondary | ICD-10-CM | POA: Diagnosis not present

## 2024-05-06 DIAGNOSIS — F1010 Alcohol abuse, uncomplicated: Secondary | ICD-10-CM

## 2024-05-06 LAB — NITRIC OXIDE: Nitric Oxide: 89

## 2024-05-06 MED ORDER — TRELEGY ELLIPTA 200-62.5-25 MCG/ACT IN AEPB: 1.0000 | INHALATION_SPRAY | Freq: Every day | RESPIRATORY_TRACT | 0 refills | Status: DC

## 2024-05-06 MED ORDER — TRELEGY ELLIPTA 200-62.5-25 MCG/ACT IN AEPB: 1.0000 | INHALATION_SPRAY | Freq: Every day | RESPIRATORY_TRACT | 11 refills | Status: AC

## 2024-05-06 MED ORDER — VENTOLIN HFA 108 (90 BASE) MCG/ACT IN AERS: 1.0000 | INHALATION_SPRAY | RESPIRATORY_TRACT | 1 refills | Status: AC | PRN

## 2024-05-06 NOTE — Patient Instructions (Signed)
 VISIT SUMMARY:  Today, you were seen for shortness of breath, cough, and wheezing that have been ongoing since January. You have a history of smoking and alcohol consumption, which may be contributing to your symptoms. We discussed your current medications and the importance of adhering to them. A new inhaler was prescribed to help manage your respiratory symptoms, and we reviewed the proper use of this inhaler. We also talked about the need to resume your reflux and blood pressure medications.  YOUR PLAN:  -ASTHMATIC BRONCHITIS: Asthmatic bronchitis is a condition where the airways in your lungs become inflamed, leading to symptoms like coughing and wheezing. You will start using Trelegy, one puff daily, to help manage this inflammation. It's important to rinse your mouth after using the inhaler. We will discontinue your previous inhalers and start this new regimen. Samples and a copay card for Trelegy were provided. A pulmonary function test will be ordered to assess your lung function.  -EMPHYSEMA: Emphysema is a lung condition that causes shortness of breath due to damage to the air sacs in the lungs. Early stages of emphysema were identified on your lung cancer screening scan. Quitting smoking is beneficial in slowing the progression of this condition.  -GASTROESOPHAGEAL REFLUX DISEASE (GERD): GERD is a condition where stomach acid frequently flows back into the tube connecting your mouth and stomach, causing irritation. This can worsen your respiratory symptoms. You should resume taking your reflux medication and significantly reduce your alcohol consumption to help improve your symptoms.  -HYPERTENSION: Hypertension is high blood pressure, which can lead to serious health problems if not managed. You should resume taking your blood pressure medication as prescribed.  INSTRUCTIONS:  Please follow up with the pulmonary function test as ordered. Make sure to take your medications as prescribed  and reduce your alcohol consumption. If you have any questions or concerns, please contact our office.  Will see you in follow-up in 2 months time call sooner should any new problems arise.

## 2024-05-06 NOTE — Progress Notes (Signed)
 Subjective:    Patient ID: Calvin Taylor, male    DOB: [birth year], 53 y.o.   MRN: [id number]  Patient Care Team: Adeline Hone, PA-C as PCP - [facility])  Chief Complaint  Patient presents with   Consult    Cough and shortness of breath on exertion. Wheezing.     BACKGROUND: Patient is a 53 year old recent former smoker with a 37-pack-year history of smoking, alcohol consumption and a history as noted below who presents for evaluation of intermittent shortness of breath, cough and wheezing.  He is kindly referred by Adeline Hone, PA-C.  HPI Discussed the use of AI scribe software for clinical note transcription with the patient, who gave verbal consent to proceed.  History of Present Illness   Calvin Taylor is a 53 year old male who presents with shortness of breath, cough, and wheezing since January. He was referred by Brice Campi, PAC, for evaluation of respiratory symptoms.  He has experienced shortness of breath, cough, and wheezing since January. Initially, he attributed these symptoms to acid reflux after dining out. The cough, which began during the daytime, progressed to nighttime and became constant, leading to difficulty breathing. He sought medical attention multiple times and received various diagnoses, including pneumonia and COVID-19.  Review of his records however revealed that he had influenza A but all other viruses were negative.  Treatment with medications for pneumonia temporarily alleviated symptoms, but they returned after the medication course ended. An inhaler has been the most effective treatment, though he still experiences 'gurgling' in his throat.  He has a 37 pack-year history of smoking but quit due to worsening cough. Smoking significantly exacerbated his symptoms. He also consumes approximately six beers a day, which worsens his reflux symptoms. He recalls an episode where drinking beer led to vomiting when his cough was severe.  He is prescribed  medication for reflux, possibly Protonix , but does not take it regularly. He also has a prescription for blood pressure medication, which he has not been taking. He has an emergency inhaler but has not used it recently due to being on the road for work.  Not taking his Breztri  either.  Overall approximately 2 weeks of not taking medications.  In January, he underwent a lung cancer screening scan.  This was unremarkable.  No weight loss or hemoptysis, but he reports thick mucus production and a sensation of something being stuck in his throat (globus sensation), which he attributes to reflux. He has not had any heart evaluations.   He works for a [employer].  Is on the road quite a bit.    DATA 12/29/2023 LDCT chest: Lung RADS 1, negative, emphysema. 01/13/2024 CXR PA and lateral: No active cardiopulmonary disease. 01/13/2024 CBC with differential: Eosinophils absolute 600 cells/uL 01/13/2024 respiratory panel: Influenza A positive, negative COVID, negative influenza B.  Negative RSV.  Review of Systems A 10 point review of systems was performed and it is as noted above otherwise negative.   Past Medical History:  Diagnosis Date   Alcohol abuse    Allergic conjunctivitis    ED (erectile dysfunction)    GERD (gastroesophageal reflux disease)    Hyperlipidemia    Hypertension    Pre-diabetes    Sebaceous cyst     Past Surgical History:  Procedure Laterality Date   COLONOSCOPY WITH PROPOFOL  N/A 04/29/2021   Procedure: COLONOSCOPY WITH PROPOFOL ;  Surgeon: Selena Daily, MD;  Location: [facility] END[facility]COPY;  Service: Gastroenterology;  Laterality: N/A;   right eye  surgery      Patient Active Problem List   Diagnosis Date Noted   Seasonal allergies 03/28/2024   Reactive airway disease with wheezing 12/29/2023   Smoking 11/03/2023   Smokes less than 1 pack a day with greater than 30 pack year history 03/25/2021   Hyperlipidemia 03/25/2021   Erectile dysfunction  03/12/2020   Current smoker 03/12/2020   Medial epicondylitis of right elbow 07/26/2019   Gastroesophageal reflux disease without esophagitis 04/21/2019   Cyst, dermoid, scalp and neck 04/21/2019   Alcohol abuse 04/21/2019   Sebaceous cyst 03/16/2019   Borderline diabetes mellitus 04/30/2018   Essential hypertension 04/28/2018    Family History  Problem Relation Age of Onset   Hypertension Mother    Hypertension Father     Social History   Tobacco Use   Smoking status: Former    Current packs/day: 0.00    Average packs/day: 1 pack/day for 37.1 years (37.1 ttl pk-yrs)    Types: Cigarettes    Start date: 57    Quit date: 01/14/2024    Years since quitting: 0.3   Smokeless tobacco: Never  Substance Use Topics   Alcohol use: Yes    Alcohol/week: 32.0 standard drinks of alcohol    Types: 30 Cans of beer, 2 Shots of liquor per week    Comment: per day beers 3 weekdays 6 weekends    No Known Allergies  Current Meds  Medication Sig   amLODipine -valsartan  (EXFORGE ) 5-320 MG tablet Take 1 tablet by mouth daily.   blood glucose meter kit and supplies KIT Dispense based on patient and insurance preference. Use up to four times daily as directed. (FOR ICD-10 E 16.2)   budeson-glycopyrrolate-formoterol (BREZTRI  AEROSPHERE) 160-9-4.8 MCG/ACT AERO inhaler Inhale 2 puffs into the lungs 2 (two) times daily.   famotidine  (PEPCID ) 20 MG tablet Take 1 tablet (20 mg total) by mouth 2 (two) times daily as needed for heartburn or indigestion.   ipratropium (ATROVENT ) 0.03 % nasal spray Place 2 sprays into both nostrils every 12 (twelve) hours.   levocetirizine (XYZAL ) 5 MG tablet Take 1 tablet (5 mg total) by mouth every evening.   nebivolol  (BYSTOLIC ) 2.5 MG tablet Take 1 tablet (2.5 mg total) by mouth daily.   ondansetron  (ZOFRAN -ODT) 4 MG disintegrating tablet Take 1 tablet (4 mg total) by mouth every 8 (eight) hours as needed for nausea or vomiting.   pantoprazole  (PROTONIX ) 40 MG tablet  Take 1 tablet (40 mg total) by mouth 2 (two) times daily.   rosuvastatin  (CRESTOR ) 10 MG tablet Take 1 tablet (10 mg total) by mouth daily.   tadalafil  (CIALIS ) 20 MG tablet TAKE 1 TABLET(20 MG) BY MOUTH EVERY OTHER DAY AS NEEDED FOR ERECTILE DYSFUNCTION   thiamine  (VITAMIN B1) 100 MG tablet Take 1 tablet (100 mg total) by mouth daily.   tiZANidine  (ZANAFLEX ) 4 MG tablet TAKE 1 TABLET(4 MG) BY MOUTH AT BEDTIME AS NEEDED FOR MUSCLE SPASMS   VENTOLIN  HFA 108 (90 Base) MCG/ACT inhaler Inhale 1-2 puffs into the lungs every 4 (four) hours as needed for wheezing or shortness of breath.  *Pt admits to not taking meds for at least 2 weeks  Immunization History  Administered Date(s) Administered   DTP 06/11/1971, 07/09/1971, 08/06/1971, 06/08/1973   Measles / Rubella 03/24/1972   OPV 06/11/1971, 07/09/1971, 08/06/1971, 06/08/1973   PFIZER Comirnaty(Gray Top)Covid-19 Tri-Sucrose Vaccine 01/14/2021   PNEUMOCOCCAL CONJUGATE-20 03/28/2024   Td 10/19/1996   Tdap 03/11/2019, 03/18/2021        Objective:  BP (!) 160/100 (BP Location: Left Arm, Patient Position: Sitting, Cuff Size: Normal)   Pulse 64   Temp 97.9 F (36.6 C) (Temporal)   Ht 5\' 9"  (1.753 m)   Wt 168 lb 12.8 oz (76.6 kg)   SpO2 98%   BMI 24.93 kg/m   SpO2: 98 %  GENERAL: Well-developed, well-nourished gentleman, no acute distress, fully ambulatory no conversational dyspnea.` HEAD: Normocephalic, atraumatic.  EYES: Pupils equal, round, reactive to light.  No scleral icterus.  MOUTH: Poor dentition, no thrush. NECK: Supple. No thyromegaly. Trachea midline. No JVD.  No adenopathy. PULMONARY: Distant breath sounds.  No adventitious sounds. CARDIOVASCULAR: S1 and S2. Regular rate and rhythm.  No rubs, murmurs or gallops heard. ABDOMEN: Benign. MUSCULOSKELETAL: No joint deformity, no clubbing, no edema.  NEUROLOGIC: No overt focal deficit, no gait disturbance, speech is fluent. SKIN: Intact,warm,dry. PSYCH: Mood and behavior  normal.  Lab Results  Component Value Date   NITRICOXIDE 89 05/06/2024  *There is evidence of type II (eosinophilic) inflammation.   Assessment & Plan:     ICD-10-CM   1. Shortness of breath  R06.02 Pulmonary function test    2. Asthmatic bronchitis, moderate persistent, uncomplicated  J45.40 Pulmonary function test    3. Gastroesophageal reflux disease, unspecified whether esophagitis present  K21.9     4. Pulmonary emphysema, unspecified emphysema type (HCC)  J43.9 Pulmonary function test    5. Alcohol abuse  F10.10     6. Essential hypertension  I10     7. Former cigarette smoker  Z87.891       Orders Placed This Encounter  Procedures   Pulmonary function test    Standing Status:   Future    Expected Date:   05/20/2024    Expiration Date:   05/06/2025    Where should this test be performed?:   Outpatient Pulmonary    What type of PFT is being ordered?:   Full PFT   Discussion:    Asthmatic bronchitis Chronic cough and wheezing since January, likely exacerbated by smoking and GERD. Airway inflammation confirmed by nitric oxide test, consistent with asthma. Symptoms include globus sensation. Erratic work schedule may contribute to medication non-adherence. - Prescribe Trelegy, one puff daily, to manage airway inflammation, hopefully will offer better compliance. - Educate on proper inhaler use and importance of rinsing mouth after use. - Discontinue previous inhalers and initiate new inhaler regimen. - Use albuterol  as needed. - Provide samples for Trelegy. - Order pulmonary function test to assess lung function.  Emphysema Early stages of emphysema identified on lung cancer screening scan. Smoking cessation is beneficial in slowing progression.  Gastroesophageal reflux disease (GERD) Reflux symptoms potentially exacerbating respiratory issues. Non-adherence to reflux medication reported. Alcohol consumption may worsen reflux symptoms. - Advise resumption of reflux  medication. - Recommend significant reduction in alcohol consumption to improve reflux symptoms.  Hypertension Elevated blood pressure with reported non-adherence to antihypertensive medication. - Advise resumption of antihypertensive medication.      Advised if symptoms do not improve or worsen, to please contact office for sooner follow up or seek emergency care.    I spent 60 minutes of dedicated to the care of this patient on the date of this encounter to include pre-visit review of records, face-to-face time with the patient discussing conditions above, post visit ordering of testing, clinical documentation with the electronic health record, making appropriate referrals as documented, and communicating necessary findings to members of the patients care team.   C. Chloe Counter,  MD Advanced Bronchoscopy [facility]    *This note was dictated using voice recognition software/Dragon.  Despite best efforts to proofread, errors can occur which can change the meaning. Any transcriptional errors that result from this process are unintentional and may not be fully corrected at the time of dictation.

## 2024-05-12 DIAGNOSIS — R519 Headache, unspecified: Secondary | ICD-10-CM | POA: Diagnosis not present

## 2024-06-20 ENCOUNTER — Other Ambulatory Visit: Payer: Self-pay | Admitting: Family Medicine

## 2024-06-20 DIAGNOSIS — E785 Hyperlipidemia, unspecified: Secondary | ICD-10-CM

## 2024-06-20 DIAGNOSIS — I10 Essential (primary) hypertension: Secondary | ICD-10-CM

## 2024-06-22 NOTE — Telephone Encounter (Signed)
 The original prescription was discontinued on 03/28/2024 by Tapia, Leisa, PA-C. Renewing this prescription may not be appropriate.   Requested Prescriptions  Refused Prescriptions Disp Refills   lisinopril  (ZESTRIL ) 40 MG tablet [Pharmacy Med Name: LISINOPRIL  40MG  TABLETS] 90 tablet 0    Sig: TAKE 1 TABLET(40 MG) BY MOUTH DAILY     Cardiovascular:  ACE Inhibitors Failed - 06/22/2024  4:40 PM      Failed - Last BP in normal range    BP Readings from Last 1 Encounters:  05/06/24 (!) 160/100         Passed - Cr in normal range and within 180 days    Creat  Date Value Ref Range Status  03/06/2023 1.16 0.70 - 1.30 mg/dL Final   Creatinine, Ser  Date Value Ref Range Status  01/13/2024 1.19 0.61 - 1.24 mg/dL Final         Passed - K in normal range and within 180 days    Potassium  Date Value Ref Range Status  01/13/2024 4.0 3.5 - 5.1 mmol/L Final         Passed - Patient is not pregnant      Passed - Valid encounter within last 6 months    Recent Outpatient Visits           2 months ago Essential hypertension   [facility] Tapia, Leisa, PA-C               atorvastatin  (LIPITOR) 10 MG tablet [Pharmacy Med Name: ATORVASTATIN  10MG  TABLETS] 90 tablet 0    Sig: TAKE 1 TABLET(10 MG) BY MOUTH DAILY     Cardiovascular:  Antilipid - Statins Failed - 06/22/2024  4:40 PM      Failed - Lipid Panel in normal range within the last 12 months    Cholesterol, Total  Date Value Ref Range Status  11/03/2023 229 (H) 100 - 199 mg/dL Final   LDL Cholesterol (Calc)  Date Value Ref Range Status  03/06/2023 122 (H) mg/dL (calc) Final    Comment:    Reference range: <100 . Desirable range <100 mg/dL for primary prevention;   <70 mg/dL for patients with CHD or diabetic patients  with > or = 2 CHD risk factors. SABRA LDL-C is now calculated using the Martin-Hopkins  calculation, which is a validated novel method providing  better accuracy than the Friedewald  equation in the  estimation of LDL-C.  Calvin Taylor et al. SANDREA. 7986;689(80): 2061-2068  (http://education.[employer].com/faq/FAQ164)    LDL Chol Calc (NIH)  Date Value Ref Range Status  11/03/2023 129 (H) 0 - 99 mg/dL Final   HDL  Date Value Ref Range Status  11/03/2023 85 >39 mg/dL Final   Triglycerides  Date Value Ref Range Status  11/03/2023 87 0 - 149 mg/dL Final         Passed - Patient is not pregnant      Passed - Valid encounter within last 12 months    Recent Outpatient Visits           2 months ago Essential hypertension   [facility] Leavy Mole, PA-C

## 2024-07-13 ENCOUNTER — Ambulatory Visit: Admitting: Pulmonary Disease

## 2024-07-13 ENCOUNTER — Encounter

## 2024-07-29 ENCOUNTER — Other Ambulatory Visit: Payer: Self-pay

## 2024-08-01 ENCOUNTER — Encounter: Payer: Self-pay | Admitting: Pulmonary Disease

## 2024-08-01 ENCOUNTER — Other Ambulatory Visit
Admission: RE | Admit: 2024-08-01 | Discharge: 2024-08-01 | Disposition: A | Discharge status: Home / Self Care | Source: Ambulatory Visit | Attending: Pulmonary Disease | Admitting: Pulmonary Disease

## 2024-08-01 ENCOUNTER — Ambulatory Visit: Admitting: Pulmonary Disease

## 2024-08-01 VITALS — BP 140/86 | HR 66 | Temp 97.8°F | Ht 69.0 in | Wt 169.8 lb

## 2024-08-01 DIAGNOSIS — J4540 Moderate persistent asthma, uncomplicated: Secondary | ICD-10-CM | POA: Insufficient documentation

## 2024-08-01 DIAGNOSIS — R0602 Shortness of breath: Secondary | ICD-10-CM

## 2024-08-01 DIAGNOSIS — J439 Emphysema, unspecified: Secondary | ICD-10-CM

## 2024-08-01 DIAGNOSIS — J4489 Other specified chronic obstructive pulmonary disease: Secondary | ICD-10-CM | POA: Diagnosis not present

## 2024-08-01 LAB — CBC WITH DIFFERENTIAL/PLATELET
Abs Immature Granulocytes: 0.02 K/uL (ref 0.00–0.07)
Basophils Absolute: 0.1 K/uL (ref 0.0–0.1)
Basophils Relative: 1 %
Eosinophils Absolute: 0.3 K/uL (ref 0.0–0.5)
Eosinophils Relative: 5 %
HCT: 44.4 % (ref 39.0–52.0)
Hemoglobin: 15.2 g/dL (ref 13.0–17.0)
Immature Granulocytes: 0 %
Lymphocytes Relative: 30 %
Lymphs Abs: 1.5 K/uL (ref 0.7–4.0)
MCH: 31.5 pg (ref 26.0–34.0)
MCHC: 34.2 g/dL (ref 30.0–36.0)
MCV: 92.1 fL (ref 80.0–100.0)
Monocytes Absolute: 0.5 K/uL (ref 0.1–1.0)
Monocytes Relative: 10 %
Neutro Abs: 2.8 K/uL (ref 1.7–7.7)
Neutrophils Relative %: 54 %
Platelets: 205 K/uL (ref 150–400)
RBC: 4.82 MIL/uL (ref 4.22–5.81)
RDW: 13.1 % (ref 11.5–15.5)
WBC: 5.1 K/uL (ref 4.0–10.5)
nRBC: 0 % (ref 0.0–0.2)

## 2024-08-01 LAB — PULMONARY FUNCTION TEST
DL/VA % pred: 91 %
DL/VA: 4.02 ml/min/mmHg/L
DLCO unc % pred: 78 %
DLCO unc: 21.87 ml/min/mmHg
FEF 25-75 Post: 2.16 L/s
FEF 25-75 Pre: 1.32 L/s
FEF2575-%Change-Post: 63 %
FEF2575-%Pred-Post: 66 %
FEF2575-%Pred-Pre: 40 %
FEV1-%Change-Post: 14 %
FEV1-%Pred-Post: 76 %
FEV1-%Pred-Pre: 66 %
FEV1-Post: 2.83 L
FEV1-Pre: 2.48 L
FEV1FVC-%Change-Post: 10 %
FEV1FVC-%Pred-Pre: 85 %
FEV6-%Change-Post: 4 %
FEV6-%Pred-Post: 83 %
FEV6-%Pred-Pre: 79 %
FEV6-Post: 3.85 L
FEV6-Pre: 3.7 L
FEV6FVC-%Change-Post: 1 %
FEV6FVC-%Pred-Post: 103 %
FEV6FVC-%Pred-Pre: 102 %
FVC-%Change-Post: 2 %
FVC-%Pred-Post: 80 %
FVC-%Pred-Pre: 78 %
FVC-Post: 3.87 L
FVC-Pre: 3.76 L
Post FEV1/FVC ratio: 73 %
Post FEV6/FVC ratio: 99 %
Pre FEV1/FVC ratio: 66 %
Pre FEV6/FVC Ratio: 98 %
RV % pred: 104 %
RV: 2.14 L
TLC % pred: 88 %
TLC: 5.99 L

## 2024-08-01 LAB — NITRIC OXIDE: Nitric Oxide: 90

## 2024-08-01 NOTE — Progress Notes (Signed)
 Full PFT completed today ? ?

## 2024-08-01 NOTE — Patient Instructions (Addendum)
 VISIT SUMMARY:  Today, you were seen for breathing difficulties related to your asthma and COPD. You reported that your breathing is fair and that you use your inhaler regularly, which provides some relief. You also mentioned experiencing a persistent cough last month, which led to vomiting, likely due to poor air quality. You have quit smoking, which is beneficial for your lung health. Your work exposes you to various allergens, which may be contributing to your symptoms.  YOUR PLAN:  -ASTHMA WITH PERSISTENT AIRWAY INFLAMMATION: Asthma is a condition where your airways become inflamed and narrow, making it hard to breathe. Your symptoms include fair breathing and occasional need for a rescue inhaler, especially when exposed to allergens or poor air quality. We will order blood work to check for allergies and determine if biologic therapy is appropriate for you.  You are already on the highest dose of the inhaler, Trelegy, continue using the inhaler and your rescue inhaler as needed.. If biologic therapy is indicated, you will be educated on how to self-administer it.  -CHRONIC OBSTRUCTIVE PULMONARY DISEASE WITH EMPHYSEMA: COPD is a chronic lung disease that includes emphysema, which damages the air sacs in your lungs. Managing your asthma is crucial to prevent worsening of COPD symptoms. Continuing to avoid smoking is very beneficial for your lung health.  -EVALUATION FOR ALLERGIC SENSITIZATION: Allergic sensitization means your immune system overreacts to certain allergens, causing symptoms. We will order blood work to evaluate for specific allergies and may consider further allergy testing based on the results.  INSTRUCTIONS:  1. Complete the blood work as ordered to check for allergies and determine the appropriate treatment plan. 2. Continue using your inhaler as prescribed and monitor your symptoms. 3. Avoid exposure to known allergens and poor air quality as much as possible. 4. Follow up  with us  to review your blood work results and discuss the next steps in your treatment plan.

## 2024-08-01 NOTE — Patient Instructions (Signed)
 Full PFT completed today ? ?

## 2024-08-01 NOTE — Progress Notes (Signed)
 Subjective:    Patient ID: Calvin Taylor, male    DOB: 04/30/1971, 52 y.o.   MRN: [id number]  Patient Care Team: Leavy Mole, PA-C as PCP - General (Family Medicine)  Chief Complaint  Patient presents with   Follow-up    BACKGROUND/INTERVAL:Patient is a 53 year old recent former smoker with a 37-pack-year history of smoking, alcohol consumption and a history as noted below who presents for follow-up of intermittent shortness of breath, cough and wheezing.  Initially seen here on 06 May 2024 working diagnosis of asthma/asthmatic bronchitis.  He had PFTs today.  HPI Discussed the use of AI scribe software for clinical note transcription with the patient, who gave verbal consent to proceed.  History of Present Illness   Calvin Taylor is a 53 year old male with asthma and COPD who presents with breathing difficulties.  He describes his breathing as 'fair' and continues to use his inhaler, Trelegy 200, which provides some relief. He uses a rescue inhaler occasionally, particularly when not on the road, use of the inhaler appears to be when he is home based.    At the end of last month, he experienced persistent coughing that led to vomiting, which he attributes to poor air quality at the time. Currently, he does not have a cough unless he skips using his inhaler.  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 His blood work has shown elevated eosinophils in the past.  He has quit smoking, which is beneficial for his lung health, given his history of emphysema associated with COPD.   He does not endorse any symptomatology today.  We discussed his pulmonary function testing and nitric oxide  results today.  His pulmonary function testing is consistent with moderate airway obstruction, asthmatic type.  Nitric oxide  remains elevated at 90 ppb.     DATA 12/29/2023 LDCT chest: Lung  RADS 1, negative, emphysema. 01/13/2024 CXR PA and lateral: No active cardiopulmonary disease. 01/13/2024 CBC with differential: Eosinophils absolute 600 cells/uL 01/13/2024 respiratory panel: Influenza A positive, negative COVID, negative influenza B.  Negative RSV. 08/01/2024 PFTs: FEV1 2.48 L or 66% predicted, FVC 3.76 L or 78% predicted, FEV1/FVC 66%, there is significant bronchodilator response.  Lung volumes are normal.  Diffusion capacity minimally reduced however corrects to normal by alveolar volume.  Flow-volume loop consistent with airway obstruction.  Study consistent with moderate obstruction, asthmatic type.  Review of Systems A 10 point review of systems was performed and it is as noted above otherwise negative.   Patient Active Problem List   Diagnosis Date Noted   Seasonal allergies 03/28/2024   Reactive airway disease with wheezing 12/29/2023   Smoking 11/03/2023   Smokes less than 1 pack a day with greater than 30 pack year history 03/25/2021   Hyperlipidemia 03/25/2021   Erectile dysfunction 03/12/2020   Current smoker 03/12/2020   Medial epicondylitis of right elbow 07/26/2019   Gastroesophageal reflux disease without esophagitis 04/21/2019   Cyst, dermoid, scalp and neck 04/21/2019   Alcohol abuse 04/21/2019   Sebaceous cyst 03/16/2019   Borderline diabetes mellitus 04/30/2018   Essential hypertension 04/28/2018    Social History   Tobacco Use   Smoking status: Former    Current packs/day: 0.00    Average packs/day: 1 pack/day for 37.1 years (37.1 ttl pk-yrs)    Types: Cigarettes    Start date: 15    Quit date: 01/14/2024  Years since quitting: 0.5   Smokeless tobacco: Never  Substance Use Topics   Alcohol use: Yes    Alcohol/week: 32.0 standard drinks of alcohol    Types: 30 Cans of beer, 2 Shots of liquor per week    Comment: per day beers 3 weekdays 6 weekends    No Known Allergies  Current Meds  Medication Sig   amLODipine -valsartan   (EXFORGE ) 5-320 MG tablet Take 1 tablet by mouth daily.   blood glucose meter kit and supplies KIT Dispense based on patient and insurance preference. Use up to four times daily as directed. (FOR ICD-10 E 16.2)   famotidine  (PEPCID ) 20 MG tablet Take 1 tablet (20 mg total) by mouth 2 (two) times daily as needed for heartburn or indigestion.   Fluticasone-Umeclidin-Vilant (TRELEGY ELLIPTA ) 200-62.5-25 MCG/ACT AEPB Inhale 1 puff into the lungs daily.   ipratropium (ATROVENT ) 0.03 % nasal spray Place 2 sprays into both nostrils every 12 (twelve) hours.   levocetirizine (XYZAL ) 5 MG tablet Take 1 tablet (5 mg total) by mouth every evening.   nebivolol  (BYSTOLIC ) 2.5 MG tablet Take 1 tablet (2.5 mg total) by mouth daily.   ondansetron  (ZOFRAN -ODT) 4 MG disintegrating tablet Take 1 tablet (4 mg total) by mouth every 8 (eight) hours as needed for nausea or vomiting.   pantoprazole  (PROTONIX ) 40 MG tablet Take 1 tablet (40 mg total) by mouth 2 (two) times daily.   rosuvastatin  (CRESTOR ) 10 MG tablet Take 1 tablet (10 mg total) by mouth daily.   tadalafil  (CIALIS ) 20 MG tablet TAKE 1 TABLET(20 MG) BY MOUTH EVERY OTHER DAY AS NEEDED FOR ERECTILE DYSFUNCTION   tiZANidine  (ZANAFLEX ) 4 MG tablet TAKE 1 TABLET(4 MG) BY MOUTH AT BEDTIME AS NEEDED FOR MUSCLE SPASMS   VENTOLIN  HFA 108 (90 Base) MCG/ACT inhaler Inhale 1-2 puffs into the lungs every 4 (four) hours as needed for wheezing or shortness of breath.    Immunization History  Administered Date(s) Administered   DTP 06/11/1971, 07/09/1971, 08/06/1971, 06/08/1973   Measles / Rubella 03/24/1972   OPV 06/11/1971, 07/09/1971, 08/06/1971, 06/08/1973   PFIZER Comirnaty(Gray Top)Covid-19 Tri-Sucrose Vaccine 01/14/2021   PNEUMOCOCCAL CONJUGATE-20 03/28/2024   Td 10/19/1996   Tdap 03/11/2019, 03/18/2021        Objective:     BP (!) 140/86 (BP Location: Left Arm, Patient Position: Sitting, Cuff Size: Normal)   Pulse 66   Temp 97.8 F (36.6 C) (Oral)    Ht 5' 9 (1.753 m)   Wt 169 lb 12.8 oz (77 kg)   SpO2 99%   BMI 25.08 kg/m   SpO2: 99 %  GENERAL: Well-developed, well-nourished gentleman, no acute distress, fully ambulatory no conversational dyspnea.` HEAD: Normocephalic, atraumatic.  EYES: Pupils equal, round, reactive to light.  No scleral icterus.  MOUTH: Poor dentition, no thrush. NECK: Supple. No thyromegaly. Trachea midline. No JVD.  No adenopathy. PULMONARY: Good air entry bilaterally.  No adventitious sounds. CARDIOVASCULAR: S1 and S2. Regular rate and rhythm.  No rubs, murmurs or gallops heard. ABDOMEN: Benign. MUSCULOSKELETAL: No joint deformity, no clubbing, no edema.  NEUROLOGIC: No overt focal deficit, no gait disturbance, speech is fluent. SKIN: Intact,warm,dry. PSYCH: Mood and behavior normal.  Lab Results  Component Value Date   NITRICOXIDE 90 08/01/2024  *Trend 89>>90 ppb *There is evidence of high level of type II inflammation present.    Assessment & Plan:     ICD-10-CM   1. Asthma-COPD overlap syndrome (HCC)  J44.89 Nitric oxide     Allergen Panel (27) + IGE  CBC w/Diff    2. Moderate persistent asthma without complication  J45.40 Nitric oxide     Allergen Panel (27) + IGE    CBC w/Diff    3. Pulmonary emphysema, unspecified emphysema type (HCC)  J43.9 Alpha-1 antitrypsin phenotype    4. Shortness of breath  R06.02 Nitric oxide     Alpha-1 antitrypsin phenotype      Orders Placed This Encounter  Procedures   Alpha-1 antitrypsin phenotype    Standing Status:   Future    Expiration Date:   08/01/2025   Allergen Panel (27) + IGE    Standing Status:   Future    Expiration Date:   08/01/2025   CBC w/Diff    Standing Status:   Future    Expiration Date:   08/01/2025   Nitric oxide    Discussion:    Asthma with persistent airway inflammation Asthma with persistent airway inflammation, indicated by elevated nitric oxide  levels at 90. Symptoms include fair breathing and occasional need for  rescue inhaler, especially when exposed to allergens or poor air quality. Inhaler use provides relief, but nitric oxide  remains elevated. Consideration of biologic therapy to control asthma and reduce airway inflammation, crucial given the presence of emphysema. 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 Smoking cessation is beneficial for lung health. Asthma management is crucial to prevent exacerbation of COPD symptoms. - Continue asthma management to prevent COPD exacerbation - Congratulated on smoking cessation as beneficial for lung health    Former smoker No evidence of relapse.  Smoking cessation instruction/counseling given:  commended patient for quitting and reviewed strategies for preventing relapses.    Advised if symptoms do not improve or worsen, to please contact office for sooner follow up or seek emergency care.    I spent 40 minutes of dedicated to the care of this patient on the date of this encounter to include pre-visit review of records, face-to-face time with the patient discussing conditions above, post visit ordering of testing, clinical documentation with the electronic health record, making appropriate referrals as documented, and communicating necessary findings to members of the patients care team.     C. Leita Sanders, MD Advanced [facility]ch[facility]    *This note was generated using voice recognition software/Dragon and/or AI transcription program.  Despite best efforts to proofread, errors can occur which can change the meaning. Any transcriptional errors that result from this process are unintentional and may not be fully corrected at the time of  dictation.

## 2024-08-03 ENCOUNTER — Ambulatory Visit: Payer: Self-pay | Admitting: Pulmonary Disease

## 2024-08-03 LAB — ALLERGEN PANEL (27) + IGE
Alternaria Alternata IgE: <0.1 kU/L
Aspergillus Fumigatus IgE: 0.35 kU/L — AB
Bahia Grass IgE: <0.1 kU/L
Bermuda Grass IgE: <0.1 kU/L
Cat Dander IgE: 32.2 kU/L — AB
Cedar, Mountain IgE: 0.39 kU/L — AB
Cladosporium Herbarum IgE: <0.1 kU/L
Cocklebur IgE: 0.24 kU/L — AB
Cockroach, American IgE: <0.1 kU/L
Common Silver Birch IgE: <0.1 kU/L
D Farinae IgE: 8.51 kU/L — AB
D Pteronyssinus IgE: 8.09 kU/L — AB
Dog Dander IgE: 0.31 kU/L — AB
Elm, American IgE: <0.1 kU/L
Hickory, White IgE: <0.1 kU/L
IgE (Immunoglobulin E), Serum: 2503 [IU]/mL — ABNORMAL HIGH (ref 6–495)
Johnson Grass IgE: 0.12 kU/L — AB
Kentucky Bluegrass IgE: <0.1 kU/L
Maple/Box Elder IgE: <0.1 kU/L
Mucor Racemosus IgE: 0.14 kU/L — AB
Oak, White IgE: <0.1 kU/L
Penicillium Chrysogen IgE: 9.18 kU/L — AB
Pigweed, Rough IgE: <0.1 kU/L
Plantain, English IgE: <0.1 kU/L
Ragweed, Short IgE: 0.66 kU/L — AB
Setomelanomma Rostrat: 0.15 kU/L — AB
Timothy Grass IgE: 0.28 kU/L — AB
White Mulberry IgE: <0.1 kU/L

## 2024-08-03 LAB — ALPHA-1-ANTITRYPSIN PHENOTYP: A-1 Antitrypsin, Ser: 101 mg/dL (ref 101–187)

## 2024-08-04 NOTE — Telephone Encounter (Signed)
 Copied from CRM [id number]. Topic: Clinical - Lab/Test Results >> Aug 04, 2024  3:16 PM Joesph PARAS wrote: Reason for CRM: Patient now aware of results regarding allergies, states will get back to us  on if he would like to be referred to an allergist or not.

## 2024-08-04 NOTE — Telephone Encounter (Signed)
 Noted. Nothing further needed.

## 2024-08-22 ENCOUNTER — Telehealth: Payer: Self-pay

## 2024-08-22 ENCOUNTER — Other Ambulatory Visit: Payer: Self-pay

## 2024-08-22 DIAGNOSIS — K219 Gastro-esophageal reflux disease without esophagitis: Secondary | ICD-10-CM

## 2024-08-22 MED ORDER — PANTOPRAZOLE SODIUM 40 MG PO TBEC: 40.0000 mg | DELAYED_RELEASE_TABLET | Freq: Two times a day (BID) | ORAL | 0 refills | Status: DC

## 2024-08-22 NOTE — Telephone Encounter (Signed)
 Refill on pantoprazole  (PROTONIX ) 40 MG tablet

## 2024-08-22 NOTE — Telephone Encounter (Signed)
Refill sent until appt.

## 2024-09-16 ENCOUNTER — Ambulatory Visit: Admitting: Family Medicine

## 2024-09-16 ENCOUNTER — Encounter: Payer: Self-pay | Admitting: Family Medicine

## 2024-09-16 VITALS — BP 148/80 | HR 80 | Temp 98.0°F | Resp 16 | Ht 69.0 in | Wt 174.5 lb

## 2024-09-16 DIAGNOSIS — F1020 Alcohol dependence, uncomplicated: Secondary | ICD-10-CM

## 2024-09-16 DIAGNOSIS — J4489 Other specified chronic obstructive pulmonary disease: Secondary | ICD-10-CM | POA: Diagnosis not present

## 2024-09-16 DIAGNOSIS — R7303 Prediabetes: Secondary | ICD-10-CM

## 2024-09-16 DIAGNOSIS — J3089 Other allergic rhinitis: Secondary | ICD-10-CM

## 2024-09-16 DIAGNOSIS — E782 Mixed hyperlipidemia: Secondary | ICD-10-CM

## 2024-09-16 DIAGNOSIS — J302 Other seasonal allergic rhinitis: Secondary | ICD-10-CM | POA: Insufficient documentation

## 2024-09-16 DIAGNOSIS — Z87891 Personal history of nicotine dependence: Secondary | ICD-10-CM

## 2024-09-16 DIAGNOSIS — R1319 Other dysphagia: Secondary | ICD-10-CM | POA: Diagnosis not present

## 2024-09-16 DIAGNOSIS — K219 Gastro-esophageal reflux disease without esophagitis: Secondary | ICD-10-CM

## 2024-09-16 DIAGNOSIS — E519 Thiamine deficiency, unspecified: Secondary | ICD-10-CM

## 2024-09-16 DIAGNOSIS — J432 Centrilobular emphysema: Secondary | ICD-10-CM | POA: Insufficient documentation

## 2024-09-16 DIAGNOSIS — I10 Essential (primary) hypertension: Secondary | ICD-10-CM

## 2024-09-16 MED ORDER — PANTOPRAZOLE SODIUM 40 MG PO TBEC: 40.0000 mg | DELAYED_RELEASE_TABLET | ORAL | 1 refills | Status: AC

## 2024-09-16 MED ORDER — FAMOTIDINE 40 MG PO TABS: 40.0000 mg | ORAL_TABLET | Freq: Every day | ORAL | 1 refills | Status: AC

## 2024-09-16 MED ORDER — MONTELUKAST SODIUM 10 MG PO TABS
10.0000 mg | ORAL_TABLET | Freq: Every day | ORAL | 1 refills | Status: AC
Start: 2024-09-16 — End: ?

## 2024-09-16 MED ORDER — AMLODIPINE BESYLATE-VALSARTAN 5-320 MG PO TABS: 1.0000 | ORAL_TABLET | Freq: Every day | ORAL | 1 refills | Status: AC

## 2024-09-16 MED ORDER — LEVOCETIRIZINE DIHYDROCHLORIDE 5 MG PO TABS: 5.0000 mg | ORAL_TABLET | Freq: Every evening | ORAL | 1 refills | Status: AC

## 2024-09-16 NOTE — Progress Notes (Signed)
 Name: Calvin Taylor   MRN: [id number]    DOB: [birth year]   Date:09/16/2024       Progress Note  Subjective  Chief Complaint  Chief Complaint  Patient presents with   Medical Management of Chronic Issues   Discussed the use of AI scribe software for clinical note transcription with the patient, who gave verbal consent to proceed.  History of Present Illness Calvin Taylor is a 53 year old male with asthma-COPD overlap syndrome and reflux who presents for a regular follow-up.  He experiences thick, clear to whitish mucus production that he coughs up or vomits, primarily occurring at night when lying down. He describes a sensation of something being stuck in his throat, especially when eating, although it does not cause pain. No nausea, yellow mucus, or post-nasal drainage. He has a history of reflux and and also AR but is not compliant with medications .  He was told by his pulmonologist in August that he has both asthma and COPD. He experiences wheezing and coughing, particularly when he runs out of his inhaler, Trelegy, which he finds too expensive and has not been taking regularly due to cost. He is supposed to take Xyzal  for allergies. He has a history of smoking, which he quit in January due to excessive coughing. CT chest showed emphysema   He has hypertension and is prescribed Exforge  and Bystolic , which he takes inconsistently. He also has prediabetes and high cholesterol, for which he is prescribed rosuvastatin , but he is unsure about his adherence. He denies current heavy alcohol use but sill drinks two to three drinks every  night.  He has allergies, including dog and cat dander, and is supposed to take xyzal  and nasal spray. He has not been taking his allergy medications consistently.  He is a [employer], which impacts his ability to maintain a consistent diet and medication schedule. He has significantly reduced his alcohol consumption from a six to twelve pack a day to two to three  drinks a night.    Patient Active Problem List   Diagnosis Date Noted   Perennial allergic rhinitis with seasonal variation 09/16/2024   Centrilobular emphysema (HCC) 09/16/2024   Asthma-COPD overlap syndrome (HCC) 09/16/2024   Esophageal dysphagia 09/16/2024   Hyperlipidemia 03/25/2021   Erectile dysfunction 03/12/2020   Medial epicondylitis of right elbow 07/26/2019   Gastroesophageal reflux disease without esophagitis 04/21/2019   Cyst, dermoid, scalp and neck 04/21/2019   Alcoholism (HCC) 04/21/2019   Sebaceous cyst 03/16/2019   Borderline diabetes mellitus 04/30/2018   Essential hypertension 04/28/2018    Past Surgical History:  Procedure Laterality Date   COLONOSCOPY WITH PROPOFOL  N/A 04/29/2021   Procedure: COLONOSCOPY WITH PROPOFOL ;  Surgeon: Unk Corinn Skiff, MD;  Location: [facility] ENDOSCOPY;  Service: Gastroenterology;  Laterality: N/A;   right eye surgery      Family History  Problem Relation Age of Onset   Hypertension Mother    Hypertension Father     Social History   Tobacco Use   Smoking status: Former    Current packs/day: 0.00    Average packs/day: 1 pack/day for 37.1 years (37.1 ttl pk-yrs)    Types: Cigarettes    Start date: 82    Quit date: 01/14/2024    Years since quitting: 0.6   Smokeless tobacco: Never  Substance Use Topics   Alcohol use: Yes    Alcohol/week: 32.0 standard drinks of alcohol    Types: 30 Cans of beer, 2 Shots of liquor per week  Comment: per day beers 3 weekdays 6 weekends     Current Outpatient Medications:    blood glucose meter kit and supplies KIT, Dispense based on patient and insurance preference. Use up to four times daily as directed. (FOR ICD-10 E 16.2), Disp: 1 each, Rfl: 0   Fluticasone-Umeclidin-Vilant (TRELEGY ELLIPTA ) 200-62.5-25 MCG/ACT AEPB, Inhale 1 puff into the lungs daily., Disp: 60 each, Rfl: 11   ipratropium (ATROVENT ) 0.03 % nasal spray, Place 2 sprays into both nostrils every 12 (twelve) hours.,  Disp: 30 mL, Rfl: 12   montelukast (SINGULAIR) 10 MG tablet, Take 1 tablet (10 mg total) by mouth at bedtime., Disp: 90 tablet, Rfl: 1   rosuvastatin  (CRESTOR ) 10 MG tablet, Take 1 tablet (10 mg total) by mouth daily., Disp: 90 tablet, Rfl: 3   tadalafil  (CIALIS ) 20 MG tablet, TAKE 1 TABLET(20 MG) BY MOUTH EVERY OTHER DAY AS NEEDED FOR ERECTILE DYSFUNCTION, Disp: 10 tablet, Rfl: 1   tiZANidine  (ZANAFLEX ) 4 MG tablet, TAKE 1 TABLET(4 MG) BY MOUTH AT BEDTIME AS NEEDED FOR MUSCLE SPASMS, Disp: 30 tablet, Rfl: 0   VENTOLIN  HFA 108 (90 Base) MCG/ACT inhaler, Inhale 1-2 puffs into the lungs every 4 (four) hours as needed for wheezing or shortness of breath., Disp: 6.7 g, Rfl: 1   amLODipine -valsartan  (EXFORGE ) 5-320 MG tablet, Take 1 tablet by mouth daily., Disp: 90 tablet, Rfl: 1   famotidine  (PEPCID ) 40 MG tablet, Take 1 tablet (40 mg total) by mouth at bedtime., Disp: 90 tablet, Rfl: 1   levocetirizine (XYZAL ) 5 MG tablet, Take 1 tablet (5 mg total) by mouth every evening., Disp: 90 tablet, Rfl: 1   pantoprazole  (PROTONIX ) 40 MG tablet, Take 1 tablet (40 mg total) by mouth every morning., Disp: 90 tablet, Rfl: 1   thiamine  (VITAMIN B1) 100 MG tablet, Take 1 tablet (100 mg total) by mouth daily. (Patient not taking: Reported on 09/16/2024), Disp: 90 tablet, Rfl: 1  No Known Allergies  I personally reviewed active problem list, medication list, allergies with the patient/caregiver today.   ROS  Ten systems reviewed and is negative except as mentioned in HPI    Objective Physical Exam VITALS: BP- 148/80 CONSTITUTIONAL: Patient appears well-developed and well-nourished. No distress. HEENT: Head atraumatic, normocephalic, neck supple. CARDIOVASCULAR: Normal rate, regular rhythm and normal heart sounds. No murmur heard. No BLE edema. PULMONARY: Effort normal and breath sounds normal. Lungs clear to auscultation bilaterally. No respiratory distress. ABDOMINAL: Abdomen non-tender. There is no  tenderness or distention. MUSCULOSKELETAL: Normal gait. Without gross motor or sensory deficit. PSYCHIATRIC: Patient has a normal mood and affect. Behavior is normal. Judgment and thought content normal.  Vitals:   09/16/24 1344  BP: (!) 148/80  Pulse: 80  Resp: 16  Temp: 98 F (36.7 C)  TempSrc: Oral  SpO2: 99%  Weight: 174 lb 8 oz (79.2 kg)  Height: 5' 9 (1.753 m)    Body mass index is 25.77 kg/m.  Recent Results (from the past 2160 hours)  Pulmonary function test     Status: None   Collection Time: 08/01/24  8:05 AM  Result Value Ref Range   FVC-Pre 3.76 L   FVC-%Pred-Pre 78 %   FVC-Post 3.87 L   FVC-%Pred-Post 80 %   FVC-%Change-Post 2 %   FEV1-Pre 2.48 L   FEV1-%Pred-Pre 66 %   FEV1-Post 2.83 L   FEV1-%Pred-Post 76 %   FEV1-%Change-Post 14 %   FEV6-Pre 3.70 L   FEV6-%Pred-Pre 79 %   FEV6-Post 3.85 L  FEV6-%Pred-Post 83 %   FEV6-%Change-Post 4 %   Pre FEV1/FVC ratio 66 %   FEV1FVC-%Pred-Pre 85 %   Post FEV1/FVC ratio 73 %   FEV1FVC-%Change-Post 10 %   Pre FEV6/FVC Ratio 98 %   FEV6FVC-%Pred-Pre 102 %   Post FEV6/FVC ratio 99 %   FEV6FVC-%Pred-Post 103 %   FEV6FVC-%Change-Post 1 %   FEF 25-75 Pre 1.32 L/sec   FEF2575-%Pred-Pre 40 %   FEF 25-75 Post 2.16 L/sec   FEF2575-%Pred-Post 66 %   FEF2575-%Change-Post 63 %   RV 2.14 L   RV % pred 104 %   TLC 5.99 L   TLC % pred 88 %   DLCO unc 21.87 ml/min/mmHg   DLCO unc % pred 78 %   DL/VA 5.97 ml/min/mmHg/L   DL/VA % pred 91 %  Nitric oxide      Status: None   Collection Time: 08/01/24  9:33 AM  Result Value Ref Range   Nitric Oxide  90   CBC w/Diff     Status: None   Collection Time: 08/01/24 10:31 AM  Result Value Ref Range   WBC 5.1 4.0 - 10.5 K/uL   RBC 4.82 4.22 - 5.81 MIL/uL   Hemoglobin 15.2 13.0 - 17.0 g/dL   HCT 55.5 60.9 - 47.9 %   MCV 92.1 80.0 - 100.0 fL   MCH 31.5 26.0 - 34.0 pg   MCHC 34.2 30.0 - 36.0 g/dL   RDW 86.8 88.4 - 84.4 %   Platelets 205 150 - 400 K/uL   nRBC 0.0 0.0 - 0.2  %   Neutrophils Relative % 54 %   Neutro Abs 2.8 1.7 - 7.7 K/uL   Lymphocytes Relative 30 %   Lymphs Abs 1.5 0.7 - 4.0 K/uL   Monocytes Relative 10 %   Monocytes Absolute 0.5 0.1 - 1.0 K/uL   Eosinophils Relative 5 %   Eosinophils Absolute 0.3 0.0 - 0.5 K/uL   Basophils Relative 1 %   Basophils Absolute 0.1 0.0 - 0.1 K/uL   Immature Granulocytes 0 %   Abs Immature Granulocytes 0.02 0.00 - 0.07 K/uL    Comment: Performed at [facility], [address] [place], [place] [address]  Allergen Panel (27) + IGE     Status: Abnormal   Collection Time: 08/01/24 10:31 AM  Result Value Ref Range   Class Description Allergens Comment     Comment: (NOTE)    Levels of Specific IgE       Class  Description of Class    ---------------------------  -----  --------------------                   < 0.10         0         Negative           0.10 -    0.31         0/I       Equivocal/Low           0.32 -    0.55         I         Low           0.56 -    1.40         II        Moderate           1.41 -    3.90  III       High           3.91 -   19.00         IV        Very High          19.01 -  100.00         V         Very High                  >100.00         VI        Very High    IgE (Immunoglobulin E), Serum 2,503 (H) 6 - 495 IU/mL   D Pteronyssinus IgE 8.09 (A) Class IV kU/L   D Farinae IgE 8.51 (A) Class IV kU/L   Cat Dander IgE 32.20 (A) Class V kU/L   Dog Dander IgE 0.31 (A) Class 0/I kU/L   [place] Grass IgE <0.10 Class 0 kU/L   Timothy Grass IgE 0.28 (A) Class 0/I kU/L   [place]  Bluegrass IgE <0.10 Class 0 kU/L   [place] Grass IgE 0.12 (A) Class 0/I kU/L   B[place] Grass IgE <0.10 Class 0 kU/L   Cockroach, American IgE <0.10 Class 0 kU/L    Comment: (NOTE) This test was developed and its performance characteristics determined by [facility].  It has not been cleared or approved by the U.S. Food and Drug Administration. The FDA has determined that such clearance or  approval is not necessary. This test is used for clinical purposes.  It should not be regarded as investigational or for research.    Penicillium Chrysogen IgE 9.18 (A) Class IV kU/L   Cladosporium Herbarum IgE <0.10 Class 0 kU/L   Aspergillus Fumigatus IgE 0.35 (A) Class I kU/L   Mucor Racemosus IgE 0.14 (A) Class 0/I kU/L   Alternaria Alternata IgE <0.10 Class 0 kU/L   Setomelanomma Rostrat 0.15 (A) Class 0/I kU/L   Oak, White IgE <0.10 Class 0 kU/L   Elm, American IgE <0.10 Class 0 kU/L   Maple/Box Elder IgE <0.10 Class 0 kU/L   Common Silver Valrie IgE <0.10 Class 0 kU/L   Hickory, White IgE <0.10 Class 0 kU/L    Comment: (NOTE) This test was developed and its performance characteristics determined by [facility].  It has not been cleared or approved by the U.S. Food and Drug Administration. The FDA has determined that such clearance or approval is not necessary. This test is used for clinical purposes.  It should not be regarded as investigational or for research.    White Mulberry IgE <0.10 Class 0 kU/L   Cedar, [place] IgE 0.39 (A) Class I kU/L   Ragweed, Short IgE 0.66 (A) Class II kU/L   Plantain, English IgE <0.10 Class 0 kU/L   Cocklebur IgE 0.24 (A) Class 0/I kU/L   Pigweed, Rough IgE <0.10 Class 0 kU/L    Comment: (NOTE) Performed At: [facility] [address] [place], [place] [id number] Jennette Shorter MD Ey:[id number]   Alpha-1-Antitrypsin Phenotyp     Status: None   Collection Time: 08/01/24 10:31 AM  Result Value Ref Range   A-1 Antitrypsin Pheno MM     Comment: (NOTE) MM Phenotype is considered to be normal, producing normal serum levels of alpha-1-protease inhibitor and not associated with clinical disease. Associated A1A total serum levels in other phenotypes and their incidence in the general population are shown in the table below. Phenotype  Population    %  function      A-1-AT Conc.*           Incidence %  compared to MM  (Typical  Range)   MM        86.5%           100%         (96 - 189)   MS         8.0%            86%         (83 - 161)   MZ         3.9%            61%         (60 - 111)   FM         0.4%           100%         (93 - 191)   SZ         0.3%            41%         (42 -  75)   SS         0.1%            64%         (62 - 119)   ZZ         0.05%           19%         (16 -  38)   FS         0.05%           70%         (70 - 128)   FZ        Unknown          46%         (44 -  88)   FF        Unknown                        Unknown *A-1-AT concentration in the homozygous MM phenotype is taken as th e reference normal. Percent deficiency in each phenotype is reported relative to this reference. Ranges used to confirm phenotype. Performed At: [facility] [address] [place], [place] [id number] Jennette Shorter MD Ey:[id number]    A-1 Antitrypsin, Ser 101 101 - 187 mg/dL    Diabetic Foot Exam:     PHQ2/9:    09/16/2024    1:43 PM 12/07/2023    2:12 PM 12/01/2023    9:21 AM 11/03/2023   10:08 AM 06/23/2023    8:38 AM  Depression screen PHQ 2/9  Decreased Interest 0 0 0 0 0  Down, Depressed, Hopeless 0 0 0 0 0  PHQ - 2 Score 0 0 0 0 0  Altered sleeping  0 1  0  Tired, decreased energy  0 0  0  Change in appetite  0 0  0  Feeling bad or failure about yourself   0 0  0  Trouble concentrating  0 0  0  Moving slowly or fidgety/restless  0 0  0  Suicidal thoughts  0 0  0  PHQ-9 Score  0 1  0  Difficult doing work/chores  Not difficult at all Not difficult at all  Not difficult at all    phq 9  is negative  Fall Risk:    09/16/2024    1:43 PM 12/07/2023    2:16 PM 11/03/2023   10:08 AM 06/23/2023    8:35 AM 06/04/2023   10:50 AM  Fall Risk   Falls in the past year? 0 0 0 0 0  Number falls in past yr: 0  0 0 0  Injury with Fall? 0  0 0 0  Risk for fall due to : No Fall Risks No Fall Risks No Fall Risks  No Fall Risks  Follow up Falls evaluation completed Falls prevention  discussed;Education provided;Falls evaluation completed   Falls prevention discussed;Education provided;Falls evaluation completed      Assessment & Plan Asthma-COPD overlap syndrome Diagnosed by pulmonologist. Symptoms exacerbated by non-adherence. Trelegy unaffordable due to high copay. - Provide Trelegy voucher. - Encourage continued Xyzal  use. - Prescribe nightly Singulair.  Hypertension Inconsistent medication adherence. Current BP 148/80 mmHg. - Instruct daily amlodipine  valsartan  at the same time. - Consider increasing amlodipine  if BP remains uncontrolled. - Advise regular BP monitoring.  Gastroesophageal reflux disease with esophageal dysphagia Symptoms suggest reflux involvement, worsened when lying down. - Refer to gastroenterology for evaluation and possible endoscopy. - Prescribe pantoprazole  in the morning and famotidine  at night.  Environmental allergies Symptoms include throat discomfort. High sensitivity to dog and cat dander. Inconsistent Xyzal  use. - Encourage consistent Xyzal  and nasal spray use.  Prediabetes Recent weight gain and dietary challenges due to occupational constraints. - Encourage dietary modifications and meal packing. - Advise reducing alcohol consumption.  Hyperlipidemia Inconsistent medication adherence. - Instruct to resume rosuvastatin . - Order lipid panel.  Alcoholism  Reduced to 2-3 drinks per night. Further reduction discussed for health improvement. - Encourage reduction to 2-3 times per week. - Order B12, folate, and vitamin B1 levels.  General Health Maintenance Declined flu vaccination due to past reactions. Importance discussed due to comorbidities. - Document flu shot declination. - Discuss shingles and hepatitis B vaccinations at future visit.

## 2024-09-21 ENCOUNTER — Ambulatory Visit: Payer: Self-pay | Admitting: Family Medicine

## 2024-09-21 LAB — COMPREHENSIVE METABOLIC PANEL WITH GFR
AG Ratio: 1.8 (calc) (ref 1.0–2.5)
ALT: 12 U/L (ref 9–46)
AST: 19 U/L (ref 10–35)
Albumin: 4.4 g/dL (ref 3.6–5.1)
Alkaline phosphatase (APISO): 75 U/L (ref 35–144)
BUN/Creatinine Ratio: 11 (calc) (ref 6–22)
BUN: 14 mg/dL (ref 7–25)
CO2: 28 mmol/L (ref 20–32)
Calcium: 9.5 mg/dL (ref 8.6–10.3)
Chloride: 102 mmol/L (ref 98–110)
Creat: 1.31 mg/dL — ABNORMAL HIGH (ref 0.70–1.30)
Globulin: 2.5 g/dL (ref 1.9–3.7)
Glucose, Bld: 76 mg/dL (ref 65–99)
Potassium: 4.1 mmol/L (ref 3.5–5.3)
Sodium: 138 mmol/L (ref 135–146)
Total Bilirubin: 0.4 mg/dL (ref 0.2–1.2)
Total Protein: 6.9 g/dL (ref 6.1–8.1)
eGFR: 65 mL/min/1.73m2 (ref >=60)

## 2024-09-21 LAB — HEMOGLOBIN A1C
Hgb A1c MFr Bld: 5.8 % — ABNORMAL HIGH (ref <5.7)
Mean Plasma Glucose: 120 mg/dL
eAG (mmol/L): 6.6 mmol/L

## 2024-09-21 LAB — LIPID PANEL
Cholesterol: 197 mg/dL (ref <200)
HDL: 87 mg/dL (ref >=40)
LDL Cholesterol (Calc): 89 mg/dL
Non-HDL Cholesterol (Calc): 110 mg/dL (ref <130)
Total CHOL/HDL Ratio: 2.3 (calc) (ref <5.0)
Triglycerides: 112 mg/dL (ref <150)

## 2024-09-21 LAB — B12 AND FOLATE PANEL
Folate: 18.7 ng/mL
Vitamin B-12: 515 pg/mL (ref 200–1100)

## 2024-09-21 LAB — VITAMIN B1: Vitamin B1 (Thiamine): 13 nmol/L (ref 8–30)

## 2024-10-05 ENCOUNTER — Ambulatory Visit: Admitting: Pulmonary Disease

## 2024-10-18 ENCOUNTER — Encounter: Payer: Self-pay | Admitting: Family Medicine

## 2024-12-20 ENCOUNTER — Ambulatory Visit: Admitting: Family Medicine

## 2025-01-20 NOTE — Patient Instructions (Incomplete)

## 2025-01-23 ENCOUNTER — Encounter: Admitting: Family Medicine

## 2025-02-28 ENCOUNTER — Encounter: Admitting: Family Medicine
